# Patient Record
Sex: Male | Born: 1940 | Race: White | Hispanic: No | Marital: Married | Smoking: Former smoker
Health system: Southern US, Community
[De-identification: ages and names within clinical notes are randomized; demographics above are authoritative.]

## PROBLEM LIST (undated history)

## (undated) DIAGNOSIS — N289 Disorder of kidney and ureter, unspecified: Secondary | ICD-10-CM

## (undated) DIAGNOSIS — E7800 Pure hypercholesterolemia, unspecified: Secondary | ICD-10-CM

## (undated) DIAGNOSIS — K219 Gastro-esophageal reflux disease without esophagitis: Secondary | ICD-10-CM

## (undated) DIAGNOSIS — N400 Enlarged prostate without lower urinary tract symptoms: Secondary | ICD-10-CM

## (undated) DIAGNOSIS — I219 Acute myocardial infarction, unspecified: Secondary | ICD-10-CM

## (undated) DIAGNOSIS — I10 Essential (primary) hypertension: Secondary | ICD-10-CM

## (undated) HISTORY — PX: CHOLECYSTECTOMY: SHX55

## (undated) HISTORY — DX: Acute myocardial infarction, unspecified: I21.9

## (undated) HISTORY — PX: COLONOSCOPY: SHX174

## (undated) HISTORY — PX: APPENDECTOMY: SHX54

## (undated) HISTORY — PX: FACIAL COSMETIC SURGERY: SHX629

---

## 2011-04-17 ENCOUNTER — Emergency Department (HOSPITAL_COMMUNITY): Payer: Medicare Other

## 2011-04-17 ENCOUNTER — Encounter (HOSPITAL_COMMUNITY): Payer: Self-pay

## 2011-04-17 ENCOUNTER — Emergency Department (HOSPITAL_COMMUNITY)
Admission: EM | Admit: 2011-04-17 | Discharge: 2011-04-17 | Disposition: A | Discharge status: Home / Self Care | Payer: Medicare Other | Attending: Emergency Medicine | Admitting: Emergency Medicine

## 2011-04-17 ENCOUNTER — Other Ambulatory Visit: Payer: Self-pay

## 2011-04-17 DIAGNOSIS — M549 Dorsalgia, unspecified: Secondary | ICD-10-CM | POA: Insufficient documentation

## 2011-04-17 DIAGNOSIS — Z7982 Long term (current) use of aspirin: Secondary | ICD-10-CM | POA: Insufficient documentation

## 2011-04-17 DIAGNOSIS — I10 Essential (primary) hypertension: Secondary | ICD-10-CM | POA: Insufficient documentation

## 2011-04-17 DIAGNOSIS — R109 Unspecified abdominal pain: Secondary | ICD-10-CM | POA: Insufficient documentation

## 2011-04-17 DIAGNOSIS — E119 Type 2 diabetes mellitus without complications: Secondary | ICD-10-CM | POA: Insufficient documentation

## 2011-04-17 DIAGNOSIS — K219 Gastro-esophageal reflux disease without esophagitis: Secondary | ICD-10-CM | POA: Insufficient documentation

## 2011-04-17 DIAGNOSIS — H538 Other visual disturbances: Secondary | ICD-10-CM | POA: Insufficient documentation

## 2011-04-17 DIAGNOSIS — E7800 Pure hypercholesterolemia, unspecified: Secondary | ICD-10-CM | POA: Insufficient documentation

## 2011-04-17 DIAGNOSIS — M79609 Pain in unspecified limb: Secondary | ICD-10-CM | POA: Insufficient documentation

## 2011-04-17 DIAGNOSIS — Z79899 Other long term (current) drug therapy: Secondary | ICD-10-CM | POA: Insufficient documentation

## 2011-04-17 DIAGNOSIS — R079 Chest pain, unspecified: Secondary | ICD-10-CM | POA: Insufficient documentation

## 2011-04-17 HISTORY — DX: Gastro-esophageal reflux disease without esophagitis: K21.9

## 2011-04-17 HISTORY — DX: Essential (primary) hypertension: I10

## 2011-04-17 HISTORY — DX: Disorder of kidney and ureter, unspecified: N28.9

## 2011-04-17 HISTORY — DX: Pure hypercholesterolemia, unspecified: E78.00

## 2011-04-17 LAB — CBC
HCT: 43.6 % (ref 39.0–52.0)
MCHC: 34.6 g/dL (ref 30.0–36.0)
RDW: 13.3 % (ref 11.5–15.5)

## 2011-04-17 LAB — URINALYSIS, ROUTINE W REFLEX MICROSCOPIC
Bilirubin Urine: NEGATIVE
Glucose, UA: NEGATIVE mg/dL
Ketones, ur: NEGATIVE mg/dL
pH: 7 (ref 5.0–8.0)

## 2011-04-17 LAB — TROPONIN I
Troponin I: <0.3 ng/mL (ref <0.30)
Troponin I: <0.3 ng/mL (ref <0.30)

## 2011-04-17 LAB — COMPREHENSIVE METABOLIC PANEL
AST: 28 U/L (ref 0–37)
BUN: 16 mg/dL (ref 6–23)
CO2: 26 mEq/L (ref 19–32)
Chloride: 105 mEq/L (ref 96–112)
Creatinine, Ser: 1.09 mg/dL (ref 0.50–1.35)
GFR calc Af Amer: 79 mL/min — ABNORMAL LOW (ref >90)
GFR calc non Af Amer: 68 mL/min — ABNORMAL LOW (ref >90)
Glucose, Bld: 118 mg/dL — ABNORMAL HIGH (ref 70–99)
Total Bilirubin: 1.2 mg/dL (ref 0.3–1.2)

## 2011-04-17 LAB — LIPASE, BLOOD: Lipase: 34 U/L (ref 11–59)

## 2011-04-17 LAB — DIFFERENTIAL
Basophils Absolute: 0 10*3/uL (ref 0.0–0.1)
Basophils Relative: 0 % (ref 0–1)
Monocytes Absolute: 0.5 10*3/uL (ref 0.1–1.0)
Neutro Abs: 2.3 10*3/uL (ref 1.7–7.7)

## 2011-04-17 MED ORDER — CEPHALEXIN 500 MG PO CAPS: 500.0000 mg | ORAL_CAPSULE | Freq: Four times a day (QID) | ORAL | Status: AC

## 2011-04-17 MED ORDER — HYDROCODONE-ACETAMINOPHEN 5-325 MG PO TABS: 1.0000 | ORAL_TABLET | Freq: Four times a day (QID) | ORAL | Status: AC | PRN

## 2011-04-17 MED ORDER — IOHEXOL 300 MG/ML  SOLN: 40.0000 mL | Freq: Once | INTRAMUSCULAR | Status: DC | PRN

## 2011-04-17 MED ORDER — IOHEXOL 300 MG/ML  SOLN
100.0000 mL | Freq: Once | INTRAMUSCULAR | Status: AC | PRN
  Administered 2011-04-17: 100 mL via INTRAVENOUS

## 2011-04-17 MED ORDER — SODIUM CHLORIDE 0.9 % IV BOLUS (SEPSIS)
1000.0000 mL | Freq: Once | INTRAVENOUS | Status: AC
  Administered 2011-04-17: 1000 mL via INTRAVENOUS

## 2011-04-17 MED ORDER — ONDANSETRON HCL 4 MG/2ML IJ SOLN
4.0000 mg | Freq: Once | INTRAMUSCULAR | Status: AC
  Administered 2011-04-17: 4 mg via INTRAVENOUS
  Filled 2011-04-17: qty 2

## 2011-04-17 MED ORDER — SODIUM CHLORIDE 0.9 % IV SOLN: INTRAVENOUS | Status: DC

## 2011-04-17 NOTE — ED Notes (Signed)
Pt alert & oriented x4, stable gait. Pt given discharge instructions, paperwork & prescription(s). Patient instructed to stop at the registration desk to finish any additional paperwork. pt verbalized understanding. Pt left department w/ no further questions.  

## 2011-04-17 NOTE — ED Notes (Signed)
Pt reports pain in r groin over a week ago.  Says went to PCP and was told may have a kidney stone.  Reports history of stones.  Says has been taking ibuprofen and drinking cranberry juice.  C/O pain in both sides, back pain, pressure in head and pressure in chest x 3 or 4 days.  Denies N/V.

## 2011-04-17 NOTE — Discharge Instructions (Signed)
Today's workup without any sniffing cardiac findings we did fine gallstones not sure for rated the symptoms are not. Urinalysis with questionable urinary tract infection or maybe prostate infection will treat with antibiotic. [facility] [place] [facility] in [facility] next few days. Return for new or worse symptoms. Take antibiotic as directed. Hydrocodone provided for pain as needed.

## 2011-04-17 NOTE — ED Provider Notes (Addendum)
This chart was scribed for Shelda Jakes, MD by Williemae Natter. The patient was seen in room [facility] at 4:05 PM.  CSN: [id number]  Arrival date & time 04/17/11  1505   First MD Initiated Contact with Patient 04/17/11 1533      Chief Complaint  Patient presents with  . Chest Pain  . Abdominal Pain    (Consider location/radiation/quality/duration/timing/severity/associated sxs/prior treatment) HPI Gabriel Werner is a 71 y.o. male who presents to the Emergency Department complaining of acute onset mild abdominal pain and chest pain that started today. Pt is concerned that he has kidney stones. Went to PCP one week ago for some groin discomfort and pain in right flank and has been drinking cranberry juice and taking ibuprofen.  Pressure in in left side of chest and beneath axilla that started today about 2 hours ago that is intermittent and worse when sitting. Associated pain in back and left arm for 4 days. Pt reported seeing spots when driving to [place] yesterday.  [facility] [place].  Past Medical History  Diagnosis Date  . Renal disorder     kidney stones  . Hypertension   . Hypercholesterolemia   . Diabetes mellitus     "borderline"  . Acid reflux     Past Surgical History  Procedure Date  . Colonoscopy   . Facial cosmetic surgery     skin tags removed    No family history on file.  History  Substance Use Topics  . Smoking status: Not on file  . Smokeless tobacco: Not on file  . Alcohol Use: No      Review of Systems  Constitutional: Negative for fever.  HENT: Negative for congestion, sore throat and neck pain.   Eyes: Positive for visual disturbance.  Respiratory: Positive for cough (productive cough). Negative for shortness of breath.   Cardiovascular: Negative for leg swelling.  Gastrointestinal: Positive for abdominal pain. Negative for nausea and vomiting.  Genitourinary: Negative for dysuria.  Musculoskeletal: Positive for back  pain.  Skin: Negative for rash.  Neurological: Negative for headaches.    Allergies  Lisinopril  Home Medications   Current Outpatient Rx  Name Route Sig Dispense Refill  . ASPIRIN EC 81 MG PO TBEC Oral Take 81 mg by mouth daily.    Marland Kitchen DOXYCYCLINE HYCLATE 50 MG PO CAPS Oral Take 50 mg by mouth 2 (two) times daily.    Marland Kitchen LOSARTAN POTASSIUM 100 MG PO TABS Oral Take 100 mg by mouth daily.    Marland Kitchen OMEPRAZOLE 20 MG PO CPDR Oral Take 20 mg by mouth 2 (two) times daily.    Marland Kitchen SIMVASTATIN 40 MG PO TABS Oral Take 20 mg by mouth every evening.    . CEPHALEXIN 500 MG PO CAPS Oral Take 1 capsule (500 mg total) by mouth 4 (four) times daily. 40 capsule 0  . HYDROCODONE-ACETAMINOPHEN 5-325 MG PO TABS Oral Take 1-2 tablets by mouth every 6 (six) hours as needed for pain. 10 tablet 0    BP 123/66  Pulse 69  Temp(Src) 98 F (36.7 C) (Oral)  Resp 22  Ht 5\' 8"  (1.727 m)  Wt 240 lb (108.863 kg)  BMI 36.49 kg/m2  SpO2 96%  Physical Exam  Nursing note and vitals reviewed. Constitutional: He is oriented to person, place, and time. He appears well-developed and well-nourished.  HENT:  Head: Normocephalic and atraumatic.  Eyes: Conjunctivae and EOM are normal. Pupils are equal, round, and reactive to light.  Neck:  Normal range of motion. Neck supple.  Cardiovascular: Normal rate, regular rhythm and normal heart sounds.   Pulmonary/Chest: Effort normal and breath sounds normal. No respiratory distress.       Lungs clear  Abdominal: Soft. Bowel sounds are normal. There is no tenderness.  Musculoskeletal: Normal range of motion. He exhibits no edema.  Neurological: He is alert and oriented to person, place, and time. No cranial nerve deficit. He exhibits normal muscle tone.  Skin: Skin is warm and dry.       Good cap refill  Psychiatric: He has a normal mood and affect. His behavior is normal. Judgment and thought content normal.    ED Course  Procedures (including critical care time) DIAGNOSTIC  STUDIES: Oxygen Saturation is 98% on room air, normal by my interpretation.    COORDINATION OF CARE:  Medications  iohexol (OMNIPAQUE) 300 MG/ML solution 40 mL (not administered)  sodium chloride 0.9 % bolus 1,000 mL (1000 mL Intravenous Given 04/17/11 1658)  ondansetron (ZOFRAN) injection 4 mg (4 mg Intravenous Given 04/17/11 1658)     Date: 04/17/2011  Rate: 77  Rhythm: normal sinus rhythm  QRS Axis: normal  Intervals: normal  ST/T Wave abnormalities: nonspecific ST changes  Conduction Disutrbances:none  Narrative Interpretation:   Old EKG Reviewed: none available    Labs Reviewed  URINALYSIS, ROUTINE W REFLEX MICROSCOPIC - Abnormal; Notable for the following:    Color, Urine STRAW (*)    Specific Gravity, Urine <1.005 (*)    Hgb urine dipstick SMALL (*)    All other components within normal limits  COMPREHENSIVE METABOLIC PANEL - Abnormal; Notable for the following:    Glucose, Bld 118 (*)    GFR calc non Af Amer 68 (*)    GFR calc Af Amer 79 (*)    All other components within normal limits  CBC - Abnormal; Notable for the following:    Platelets 98 (*)    All other components within normal limits  TROPONIN I  LIPASE, BLOOD  DIFFERENTIAL  URINE MICROSCOPIC-ADD ON   Dg Chest 2 View  04/17/2011  *RADIOLOGY REPORT*  Clinical Data: Chest pain.  Generalized weakness.  Cough.  CHEST - 2 VIEW 04/17/2011:  Comparison: Visualized lung bases on CT abdomen pelvis obtained concurrently.  Findings: Nodule in the anterior left lower lobe as noted on the CT.  Lungs otherwise clear.  Cardiac silhouette upper normal in size to slightly enlarged.  Hilar and mediastinal contours otherwise unremarkable.  Degenerative changes involving the thoracic spine.  IMPRESSION: Left lower lobe lung nodule.  No acute cardiopulmonary disease.  Original Report Authenticated By: Arnell Sieving, M.D.   Ct Abdomen Pelvis W Contrast  04/17/2011  *RADIOLOGY REPORT*  Clinical Data: Chest and abdominal  pain.  CT ABDOMEN AND PELVIS WITH CONTRAST  Technique:  Multidetector CT imaging of the abdomen and pelvis was performed following the standard protocol during bolus administration of intravenous contrast.  Contrast:  40 ml Omnipaque-300  Comparison: Abdominal pelvic CT from 01/03/2006, [facility]  Findings: 3 mm right lower lobe lung nodule on image 4, unchanged. A lingular nodule measures 1.9 cm by 1.5 cm on image 4.  1.6 x 1.3 cm on 01/03/2006. 1.7 cm cranial caudal today on coronal image 75. 1.4 cm at the same level on the prior.  Mild cardiomegaly, without pericardial or pleural effusion.  Probable tiny cyst in the left lobe of the liver on image 14. Splenule.  Normal stomach.  Somewhat prominent pancreatic tail, including on  image 31, unchanged and therefore within normal variation.  Partially fatty replaced body and head of the pancreas.  Cholelithiasis without cholecystitis or biliary ductal dilatation.  Normal adrenal glands.  Mildly scarred interpolar left kidney. Punctate collecting systems stones within the right kidney are suspected.  Most apparent on coronal reformatted images.  Mild non aneurysmal infrarenal abdominal aortic dilatation.  This is unchanged, at 2.4 cm. No retroperitoneal or retrocrural adenopathy.  Scattered colonic diverticula.  Normal terminal ileum and appendix. Small jejunal mesenteric nodes with increased density in the mesenteric fat.  Image 42 today. Similar to on the prior exam. Small bowel otherwise within normal limits, without ascites.  No pelvic adenopathy.    Normal urinary bladder and prostate.  No significant free fluid.  Degenerative disc disease at the lumbosacral junction  IMPRESSION:  1.  No definite explanation for abdominal pain. 2.  Cholelithiasis. 3.  Punctate nonobstructive renal calculi suspected on the right. 4.  Stable mesenteric findings which may represent mesenteric adenitis/panniculitis. 5.  Posterior lingular nodule.  This has mildly enlarged  since the prior exam of 01/03/2006.  This growth pattern is not typical of malignancy.  Favored to represent interval enlargement of a benign lesion such as a hamartoma.  Low grade malignancy felt less likely. This enlargement does warrant followup with chest CT at 3 months. If multidisciplinary follow up management is desired, this is available in the [facility] through the [facility] [phone].  Original Report Authenticated By: Consuello Bossier, M.D.   6:56 PM On recheck, pt is feeling fine. Pt notified of lab results and scans. Pt to continue having cardiac markers monitored.  1. Flank pain   2. Chest pain       MDM  Workup in the emergency department negative for any sniffing cardiac event chest x-ray without any new findings other than a pulmonary nodule. CT scan does find cholelithiasis not sure if related to any of the symptoms also up in the right kidney there is a punctate renal stone but no evidence of ureteral stone. Urinalysis with a few white cells and a few red blood cells we'll treat as if UTI or maybe prostatitis urine culture sent will treat patient with Keflex for the next 10 days. troponins x2 last one done at the six-hour mark both negative.  [facility] [place] [facility] for further evaluation of the gallstones and followup of the urinary tract infection.    I personally performed the services described in this documentation, which was scribed in my presence. The recorded information has been reviewed and considered.          Shelda Jakes, MD 04/17/11 2122  Shelda Jakes, MD 04/19/11 [phone]

## 2011-04-19 LAB — URINE CULTURE
Colony Count: NO GROWTH
Culture: NO GROWTH

## 2016-05-16 ENCOUNTER — Emergency Department (HOSPITAL_COMMUNITY)
Admission: EM | Admit: 2016-05-16 | Discharge: 2016-05-16 | Disposition: A | Discharge status: Home / Self Care | Payer: Medicare Other | Attending: Emergency Medicine | Admitting: Emergency Medicine

## 2016-05-16 ENCOUNTER — Encounter (HOSPITAL_COMMUNITY): Payer: Self-pay | Admitting: Emergency Medicine

## 2016-05-16 ENCOUNTER — Emergency Department (HOSPITAL_COMMUNITY): Payer: Medicare Other

## 2016-05-16 DIAGNOSIS — Z87891 Personal history of nicotine dependence: Secondary | ICD-10-CM | POA: Insufficient documentation

## 2016-05-16 DIAGNOSIS — N50811 Right testicular pain: Secondary | ICD-10-CM | POA: Diagnosis not present

## 2016-05-16 DIAGNOSIS — Z7982 Long term (current) use of aspirin: Secondary | ICD-10-CM | POA: Insufficient documentation

## 2016-05-16 DIAGNOSIS — I10 Essential (primary) hypertension: Secondary | ICD-10-CM | POA: Diagnosis not present

## 2016-05-16 DIAGNOSIS — R1031 Right lower quadrant pain: Secondary | ICD-10-CM | POA: Diagnosis present

## 2016-05-16 DIAGNOSIS — Z79899 Other long term (current) drug therapy: Secondary | ICD-10-CM | POA: Diagnosis not present

## 2016-05-16 DIAGNOSIS — N50819 Testicular pain, unspecified: Secondary | ICD-10-CM

## 2016-05-16 DIAGNOSIS — E119 Type 2 diabetes mellitus without complications: Secondary | ICD-10-CM | POA: Insufficient documentation

## 2016-05-16 HISTORY — DX: Benign prostatic hyperplasia without lower urinary tract symptoms: N40.0

## 2016-05-16 LAB — CBC WITH DIFFERENTIAL/PLATELET
Basophils Absolute: 0 10*3/uL (ref 0.0–0.1)
Basophils Relative: 1 %
EOS ABS: 0.1 10*3/uL (ref 0.0–0.7)
Eosinophils Relative: 2 %
HCT: 44 % (ref 39.0–52.0)
Hemoglobin: 15.4 g/dL (ref 13.0–17.0)
LYMPHS ABS: 1.2 10*3/uL (ref 0.7–4.0)
Lymphocytes Relative: 30 %
MCH: 32 pg (ref 26.0–34.0)
MCHC: 35 g/dL (ref 30.0–36.0)
MCV: 91.5 fL (ref 78.0–100.0)
MONO ABS: 0.5 10*3/uL (ref 0.1–1.0)
Monocytes Relative: 13 %
NEUTROS ABS: 2.3 10*3/uL (ref 1.7–7.7)
Neutrophils Relative %: 54 %
Platelets: 92 10*3/uL — ABNORMAL LOW (ref 150–400)
RBC: 4.81 MIL/uL (ref 4.22–5.81)
RDW: 12.7 % (ref 11.5–15.5)
WBC: 4.1 10*3/uL (ref 4.0–10.5)

## 2016-05-16 LAB — URINALYSIS, ROUTINE W REFLEX MICROSCOPIC
BACTERIA UA: NONE SEEN
BILIRUBIN URINE: NEGATIVE
GLUCOSE, UA: NEGATIVE mg/dL
KETONES UR: NEGATIVE mg/dL
LEUKOCYTES UA: NEGATIVE
NITRITE: NEGATIVE
PROTEIN: NEGATIVE mg/dL
Specific Gravity, Urine: 1.01 (ref 1.005–1.030)
Squamous Epithelial / LPF: NONE SEEN
pH: 5 (ref 5.0–8.0)

## 2016-05-16 LAB — COMPREHENSIVE METABOLIC PANEL
ALT: 30 U/L (ref 17–63)
ANION GAP: 7 (ref 5–15)
AST: 26 U/L (ref 15–41)
Albumin: 4.1 g/dL (ref 3.5–5.0)
Alkaline Phosphatase: 44 U/L (ref 38–126)
BUN: 18 mg/dL (ref 6–20)
CO2: 23 mmol/L (ref 22–32)
Calcium: 9.4 mg/dL (ref 8.9–10.3)
Chloride: 106 mmol/L (ref 101–111)
Creatinine, Ser: 1.11 mg/dL (ref 0.61–1.24)
GFR calc Af Amer: >60 mL/min (ref >60)
GFR calc non Af Amer: >60 mL/min (ref >60)
Glucose, Bld: 123 mg/dL — ABNORMAL HIGH (ref 65–99)
POTASSIUM: 3.9 mmol/L (ref 3.5–5.1)
SODIUM: 136 mmol/L (ref 135–145)
TOTAL PROTEIN: 7 g/dL (ref 6.5–8.1)
Total Bilirubin: 1.7 mg/dL — ABNORMAL HIGH (ref 0.3–1.2)

## 2016-05-16 MED ORDER — TRAMADOL HCL 50 MG PO TABS: 50.0000 mg | ORAL_TABLET | Freq: Four times a day (QID) | ORAL | 0 refills | Status: DC | PRN

## 2016-05-16 NOTE — ED Triage Notes (Signed)
Patient c/o right groin pain x5 days. Per patient constant dull pain that is progressively getting worse. Unsure of any swelling. Patient does state occasional right flank pain. Hx of kidney stones in past. Per patient difficult with urinary flow due to enlarged prostate but no other urinary symptoms (pain or blood in urine).

## 2016-05-16 NOTE — ED Notes (Signed)
Dr Y in to assess 

## 2016-05-16 NOTE — ED Notes (Signed)
Patient transported to X-ray 

## 2016-05-16 NOTE — ED Notes (Signed)
Pt reports that he is unable to void at this time 

## 2016-05-16 NOTE — ED Provider Notes (Signed)
[facility] DEPT Provider Note   CSN: [id number] Arrival date & time: 05/16/16  1231     History   Chief Complaint Chief Complaint  Patient presents with  . Groin Pain    HPI Gabriel Werner is a 76 y.o. male.  HPI Patient presents with 5 days of right groin pain. States the pain is constant and dull in nature though he does have episodic sharp pain. Also has pain in the right flank occasionally. Denies any nausea or vomiting associated with this. Patient has urinary hesitancy and frequency but states this is unchanged. Due to BPH. Denies any hematuria, fever or chills. Denies any masses in the lower abdomen or scrotum. Has previous history of kidney stone. Past Medical History:  Diagnosis Date  . Acid reflux   . Diabetes mellitus    "borderline"  . Enlarged prostate   . Hypercholesterolemia   . Hypertension   . Renal disorder    kidney stones    There are no active problems to display for this patient.   Past Surgical History:  Procedure Laterality Date  . APPENDECTOMY    . CHOLECYSTECTOMY    . COLONOSCOPY    . FACIAL COSMETIC SURGERY     skin tags removed       Home Medications    Prior to Admission medications   Medication Sig Start Date End Date Taking? Authorizing Provider  aspirin EC 81 MG tablet Take 81 mg by mouth daily.   Yes Historical Provider, MD  cyclobenzaprine (FLEXERIL) 10 MG tablet Take 10 mg by mouth 3 (three) times daily as needed for muscle spasms.   Yes Historical Provider, MD  losartan (COZAAR) 100 MG tablet Take 100 mg by mouth daily.   Yes Historical Provider, MD  ranitidine (ZANTAC) 150 MG tablet Take 150 mg by mouth 2 (two) times daily.   Yes Historical Provider, MD  simvastatin (ZOCOR) 40 MG tablet Take 20 mg by mouth every evening.   Yes Historical Provider, MD  traMADol (ULTRAM) 50 MG tablet Take 1 tablet (50 mg total) by mouth every 6 (six) hours as needed. 05/16/16   Loren Racer, MD    Family History History reviewed. No  pertinent family history.  Social History Social History  Substance Use Topics  . Smoking status: Former Smoker    Packs/day: 0.50    Years: 40.00    Types: Cigarettes    Quit date: 01/25/2002  . Smokeless tobacco: Never Used  . Alcohol use No     Allergies   Lisinopril   Review of Systems Review of Systems  Constitutional: Negative for chills and fever.  Respiratory: Negative for cough and shortness of breath.   Cardiovascular: Negative for chest pain.  Gastrointestinal: Negative for abdominal pain, diarrhea, nausea and vomiting.  Genitourinary: Positive for difficulty urinating and flank pain. Negative for decreased urine volume, discharge, dysuria, hematuria, penile pain, scrotal swelling, testicular pain and urgency.  Musculoskeletal: Positive for back pain. Negative for myalgias, neck pain and neck stiffness.  Skin: Negative for rash and wound.  Neurological: Negative for dizziness, weakness, light-headedness and headaches.  All other systems reviewed and are negative.    Physical Exam Updated Vital Signs BP (!) 110/92 (BP Location: Left Arm)   Pulse 62   Temp 98 F (36.7 C) (Oral)   Resp 18   Ht  (1.727 m)   Wt 250 lb (113.4 kg)   SpO2 100%   BMI 38.01 kg/m   Physical Exam  Constitutional: He  is oriented to person, place, and time. He appears well-developed and well-nourished. No distress.  HENT:  Head: Normocephalic and atraumatic.  Mouth/Throat: Oropharynx is clear and moist. No oropharyngeal exudate.  Eyes: EOM are normal. Pupils are equal, round, and reactive to light.  Neck: Normal range of motion. Neck supple.  Cardiovascular: Normal rate and regular rhythm.  Exam reveals no gallop and no friction rub.   No murmur heard. Pulmonary/Chest: Effort normal and breath sounds normal. No respiratory distress. He has no wheezes. He has no rales. He exhibits no tenderness.  Abdominal: Soft. Bowel sounds are normal. There is no tenderness. There is no  rebound and no guarding. No hernia.  Genitourinary: Penis normal.  Genitourinary Comments: Atrophic testicles bilaterally. No testicular tenderness. No scrotal masses.  Musculoskeletal: Normal range of motion. He exhibits no edema or tenderness.  No CVA tenderness bilaterally. No midline thoracic or lumbar tenderness. No paraspinal muscular tenderness.  Neurological: He is alert and oriented to person, place, and time.  Results reviewed is without focal deficit. Sensation fully intact.  Skin: Skin is warm and dry. No rash noted. No erythema.  Psychiatric: He has a normal mood and affect. His behavior is normal.  Nursing note and vitals reviewed.    ED Treatments / Results  Labs (all labs ordered are listed, but only abnormal results are displayed) Labs Reviewed  CBC WITH DIFFERENTIAL/PLATELET - Abnormal; Notable for the following:       Result Value   Platelets 92 (*)    All other components within normal limits  COMPREHENSIVE METABOLIC PANEL - Abnormal; Notable for the following:    Glucose, Bld 123 (*)    Total Bilirubin 1.7 (*)    All other components within normal limits  URINALYSIS, ROUTINE W REFLEX MICROSCOPIC - Abnormal; Notable for the following:    Hgb urine dipstick SMALL (*)    All other components within normal limits    EKG  EKG Interpretation None       Radiology Ct Renal Stone Study  Result Date: 05/16/2016 CLINICAL DATA:  Right groin pain for the past 5 days, progressively worsening. Intermittent right flank pain. History of nephrolithiasis. Prostate enlargement. EXAM: CT ABDOMEN AND PELVIS WITHOUT CONTRAST TECHNIQUE: Multidetector CT imaging of the abdomen and pelvis was performed following the standard protocol without IV contrast. COMPARISON:  04/17/2011. FINDINGS: Lower chest: The previously demonstrated 1.9 x 1.5 cm lingular nodule currently measures 1.7 x 1.5 cm on image number 3 of series 4. The previously demonstrated 3 mm peripheral nodule in the right  lower lobe currently measures 6 mm in maximum diameter on image number 6 of series 4. This is unchanged in the coronal projection, compatible with differences in slice positioning in the axial projection. This was previously unchanged since 01/03/2006. No new nodules or pleural fluid. Hepatobiliary: Stable small left lobe liver cyst. Cholecystectomy clips. Pancreas: Unremarkable. No pancreatic ductal dilatation or surrounding inflammatory changes. Spleen: Normal in size without focal abnormality. Adrenals/Urinary Tract: Small bilateral lower pole renal calculi, measuring 3 mm or less in maximum diameter each. No bladder or ureteral calculi and no hydronephrosis. Stable lower pole left renal cortical scar. Normal appearing adrenal glands. Stomach/Bowel: Multiple colonic diverticula without evidence of diverticulitis. Surgically absent appendix. Unremarkable small bowel and stomach. Vascular/Lymphatic: Atheromatous arterial calcifications without aneurysm. No enlarged lymph nodes. Reproductive: Normal sized prostate gland containing coarse calcifications. Other: Stable accessory splenule. Small umbilical hernia containing fat. Musculoskeletal: Lumbar and lower thoracic spine degenerative changes. IMPRESSION: 1. No acute abnormality.  2. Stable benign nodules of both lung bases. 3. Small, bilateral lower pole nonobstructing renal calculi. 4. Colonic diverticulosis. Electronically Signed   By: Beckie Salts M.D.   On: 05/16/2016 14:48    Procedures Procedures (including critical care time)  Medications Ordered in ED Medications - No data to display   Initial Impression / Assessment and Plan / ED Course  I have reviewed the triage vital signs and the nursing notes.  Pertinent labs & imaging results that were available during my care of the patient were reviewed by me and considered in my medical decision making (see chart for details).     Patient with 5 days of right-sided groin pain. CT abdomen without  acute findings. Possibly patient has had a recently passed ureteral stone. Very low suspicion for testicular torsion. Patient does need to have ultrasound of the testicle other causes for his pain. We'll arrange to have testicular ultrasound tomorrow. Patient understands the need to return immediately for worsening pain, vomiting, fever or for any concerns. He also understands need to follow-up with urology.   Final Clinical Impressions(s) / ED Diagnoses   Final diagnoses:  Testicular pain, right    New Prescriptions New Prescriptions   TRAMADOL (ULTRAM) 50 MG TABLET    Take 1 tablet (50 mg total) by mouth every 6 (six) hours as needed.     Loren Racer, MD 05/16/16 [phone]

## 2016-05-16 NOTE — ED Notes (Signed)
Dr Jeannie Fend in to reassess and speak with pt regarding test results

## 2016-05-16 NOTE — ED Notes (Signed)
Pt reports R groin pain for 5 days Followed at the [facility]  Has not seen  "I got out of church and thought I would get it checked out". Per pt

## 2016-05-17 ENCOUNTER — Other Ambulatory Visit (HOSPITAL_COMMUNITY): Payer: Self-pay | Admitting: Emergency Medicine

## 2016-05-17 DIAGNOSIS — N5082 Scrotal pain: Secondary | ICD-10-CM

## 2016-05-19 ENCOUNTER — Ambulatory Visit (HOSPITAL_COMMUNITY)
Admission: RE | Admit: 2016-05-19 | Discharge: 2016-05-19 | Disposition: A | Discharge status: Home / Self Care | Payer: Medicare Other | Source: Ambulatory Visit | Attending: Emergency Medicine | Admitting: Emergency Medicine

## 2016-05-19 DIAGNOSIS — N50819 Testicular pain, unspecified: Secondary | ICD-10-CM | POA: Diagnosis present

## 2016-05-19 DIAGNOSIS — N503 Cyst of epididymis: Secondary | ICD-10-CM | POA: Diagnosis not present

## 2016-05-19 DIAGNOSIS — N5082 Scrotal pain: Secondary | ICD-10-CM | POA: Diagnosis present

## 2017-04-25 IMAGING — US US SCROTUM
1 series · 13 of 25 positions shown · non-contrast
Comparison: None.

CLINICAL DATA: Chronic pain and tingling sensation. Testicular
atrophy.

EXAM:
SCROTAL ULTRASOUND
DOPPLER ULTRASOUND OF THE TESTICLES
TECHNIQUE: Complete ultrasound examination of the testicles, epididymis, and
other scrotal structures was performed. Color and spectral Doppler
ultrasound were also utilized to evaluate blood flow to the
testicles.

[Series 1: us scrotum · 0.05mm/px · 13 of 60 slices shown]
[im 1/60]
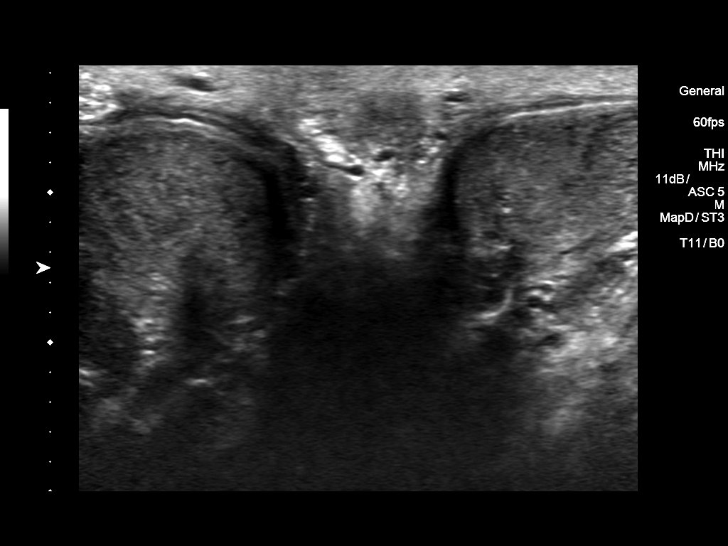
[im 5/60]
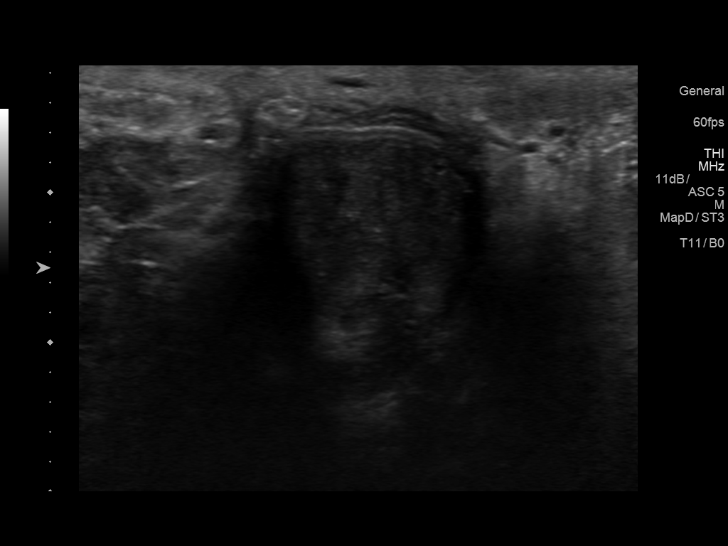
[im 10/60]
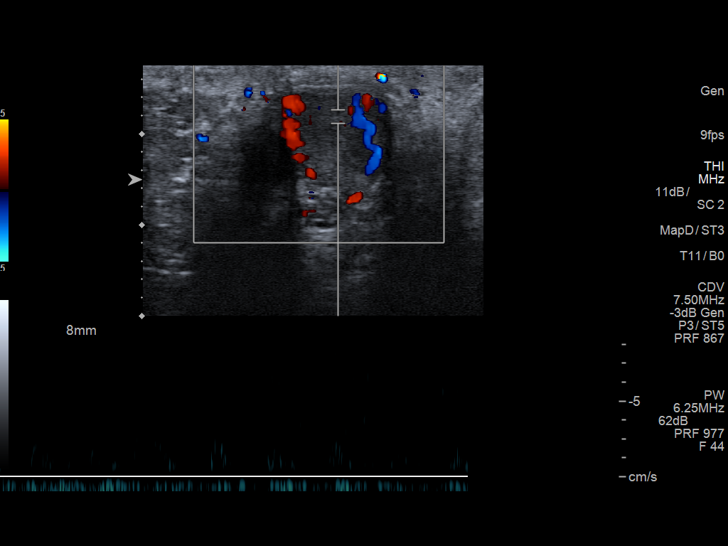
[im 15/60]
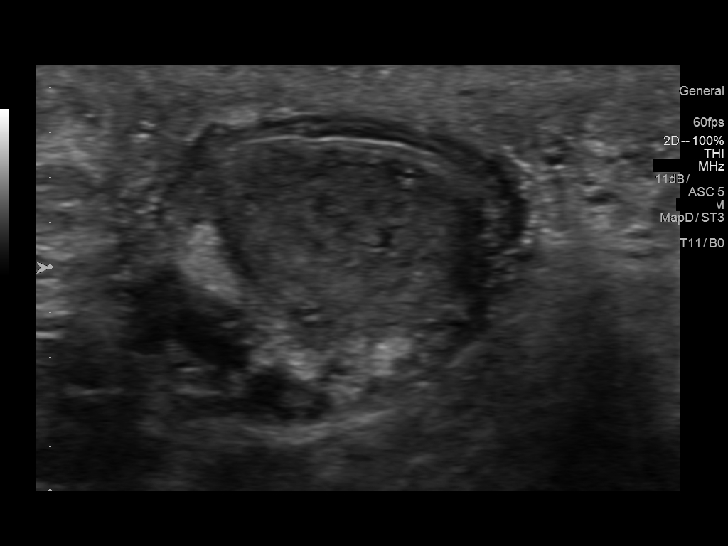
[im 20/60]
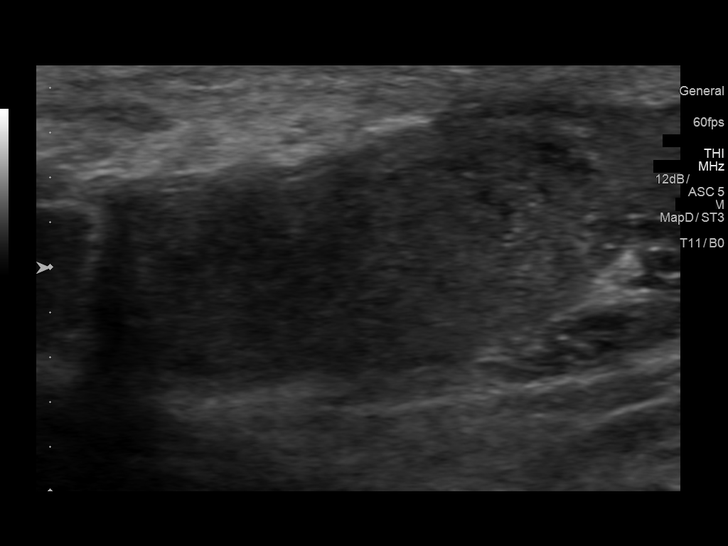
[im 25/60]
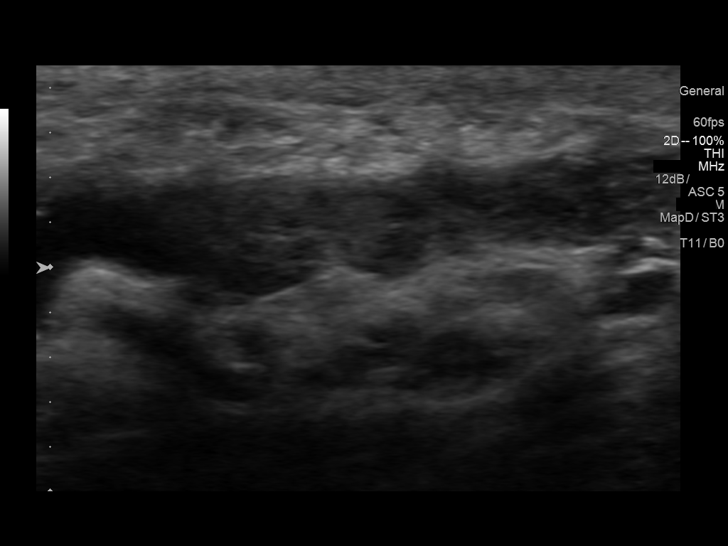
[im 30/60]
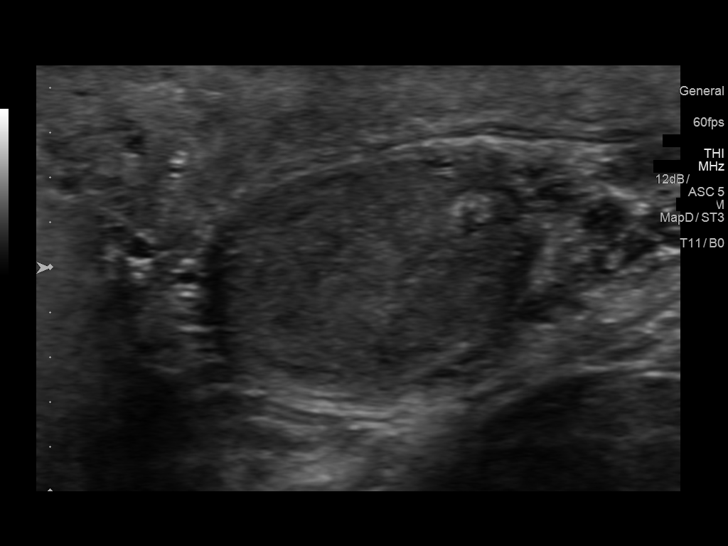
[im 35/60]
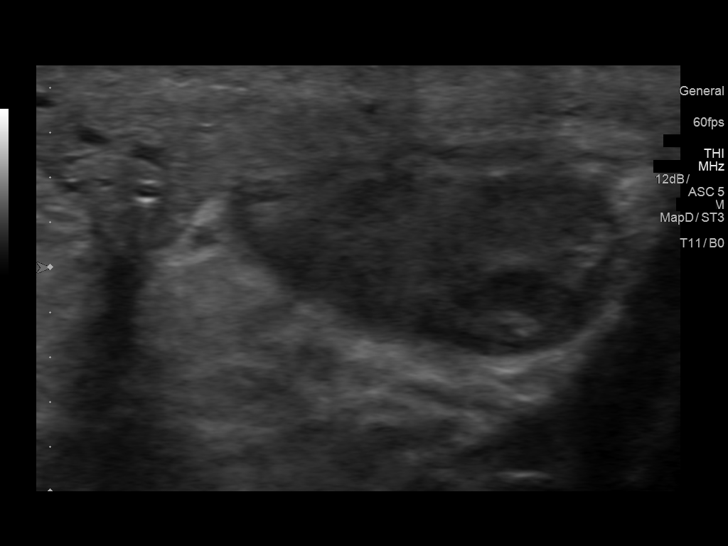
[im 40/60]
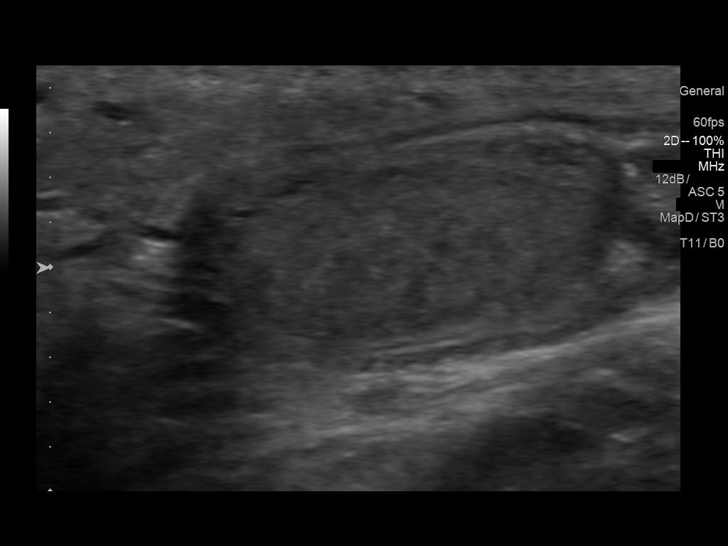
[im 45/60]
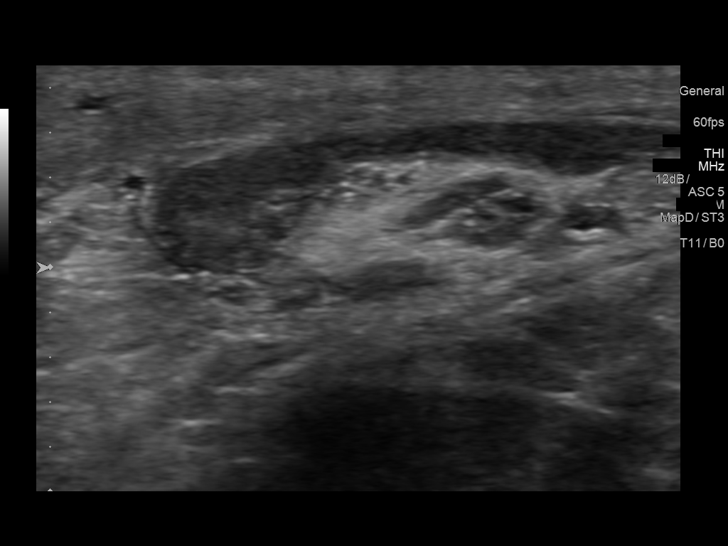
[im 50/60]
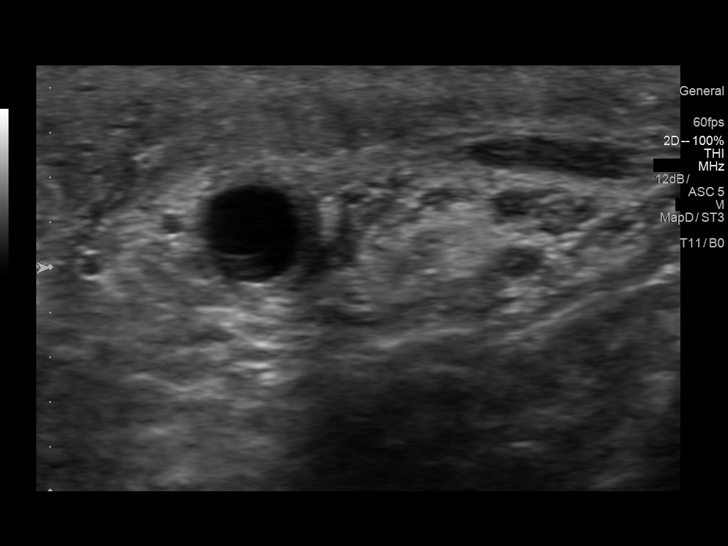
[im 55/60]
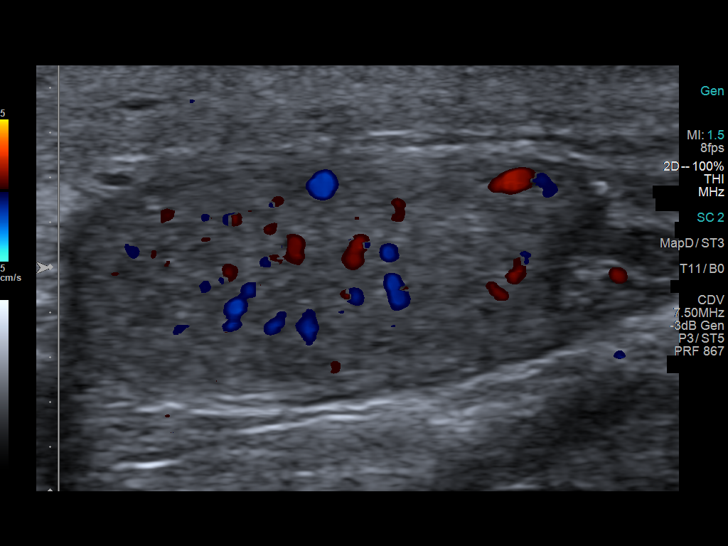
[im 60/60]
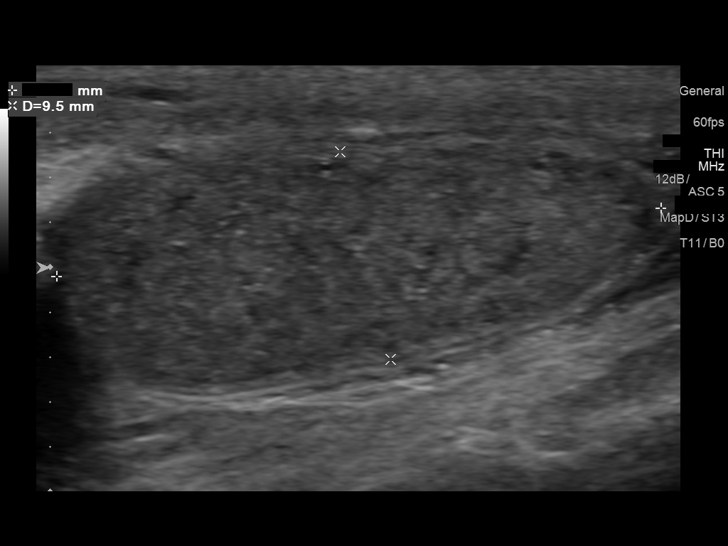

[13 of 25 positions shown; findings below may reference images not displayed]

FINDINGS: Right testicle

Measurements: 2.5 x 1.2 x 1.4 cm. No mass or microlithiasis
visualized. Echotexture of the testis is mildly inhomogeneous in a
generalized manner.

Left testicle

Measurements: 2.0 x 0.9 x 1.6 cm. No mass or microlithiasis
visualized. Echotexture of the testis is mildly inhomogeneous in a
generalized manner.

Right epididymis: Normal in size and appearance. No inflammatory
focus.

Left epididymis: There is a mildly complex cyst in the head of the
epididymis on the left measuring 5 x 5 x 4 mm. No other
extratesticular mass. No inflammatory focus.

Hydrocele:  None visualized.

Varicocele:  None visualized.

Pulsed Doppler interrogation of both testes demonstrates normal low
resistance arterial and venous waveforms bilaterally.

No scrotal abscess or scrotal wall thickening.
IMPRESSION: No testicular masses are identified. Each testis is rather small
with somewhat inhomogeneous echotexture, likely due to a degree of
underlying atrophy. No evidence of testicular torsion.

There is a small epididymal head cyst on the left with mild
septations, likely due to previous infection or hemorrhage. No other
extratesticular mass. No hyperemia or inflammation in either
extratesticular region. No appreciable hydroceles. Study otherwise
unremarkable.

## 2018-04-07 IMAGING — CT CT RENAL STONE PROTOCOL
2 of 4 series · 16 of 46 positions shown, 18 images · non-contrast
Comparison: 04/17/2011.

CLINICAL DATA: Right groin pain for the past 5 days, progressively
worsening. Intermittent right flank pain. History of
nephrolithiasis. Prostate enlargement.

EXAM:
CT ABDOMEN AND PELVIS WITHOUT CONTRAST
TECHNIQUE: Multidetector CT imaging of the abdomen and pelvis was performed
following the standard protocol without IV contrast.

[Series 2: axial st · axial · 0.95mm/px · z∈[-855,-440]mm · 13 of 91 slices shown, 15 images]
[im 4/91  soft-tissue]
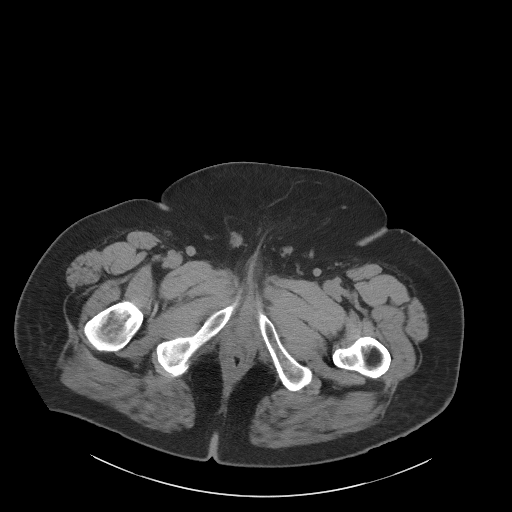
[im 4/91  bone]
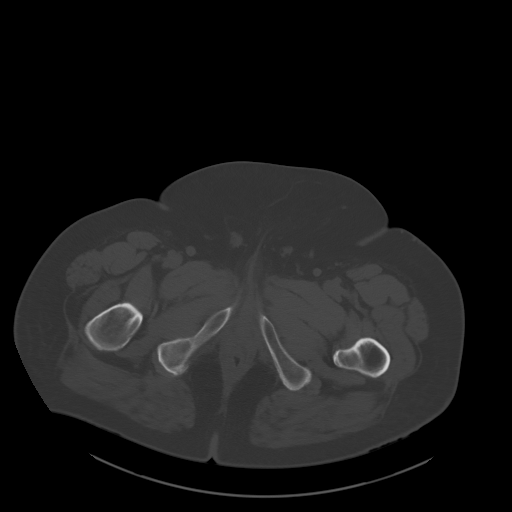
[im 11/91  soft-tissue]
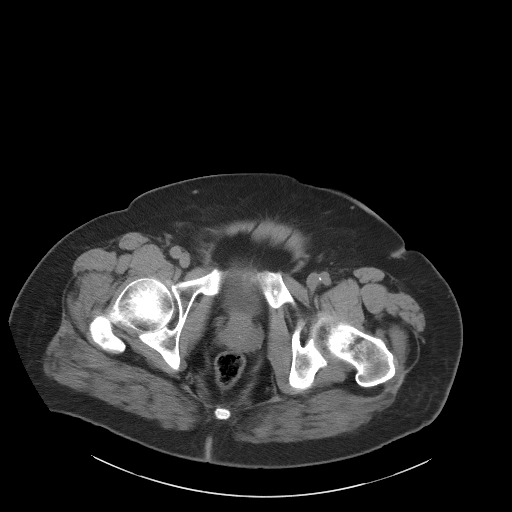
[im 19/91  soft-tissue]
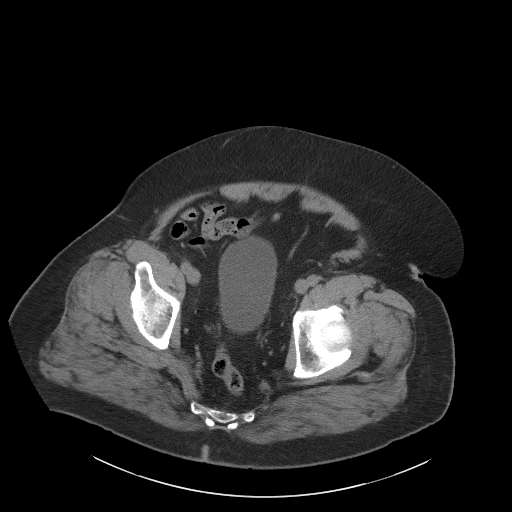
[im 26/91  soft-tissue]
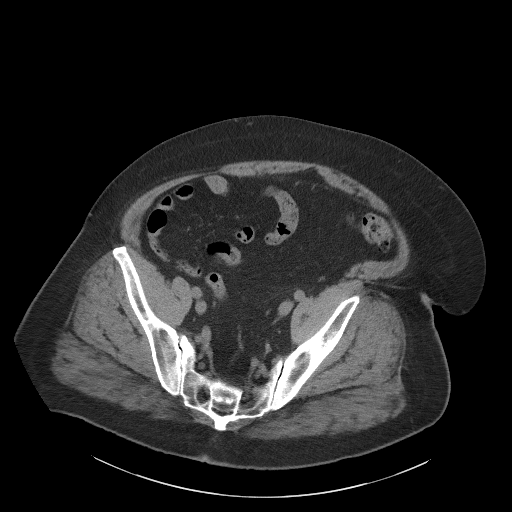
[im 33/91  soft-tissue]
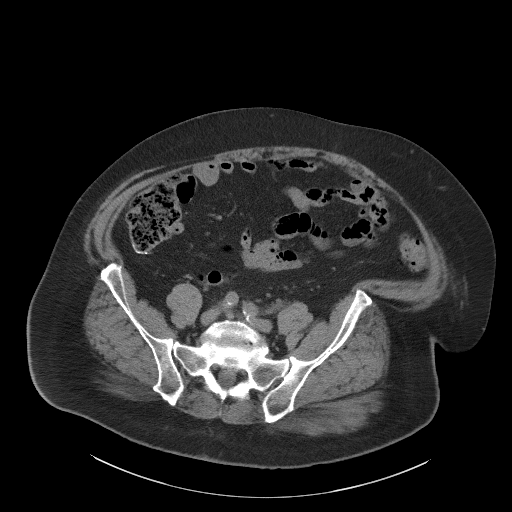
[im 40/91  soft-tissue]
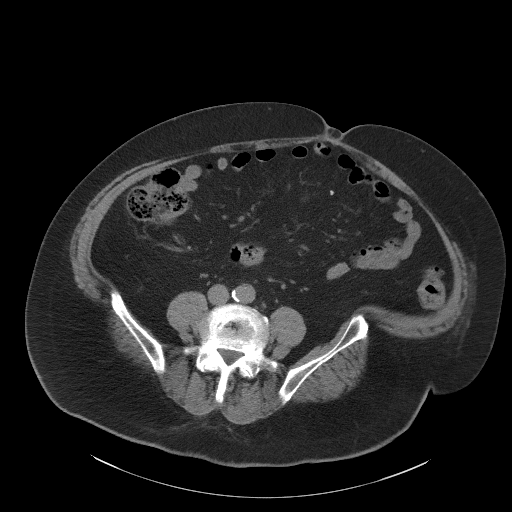
[im 47/91  soft-tissue]
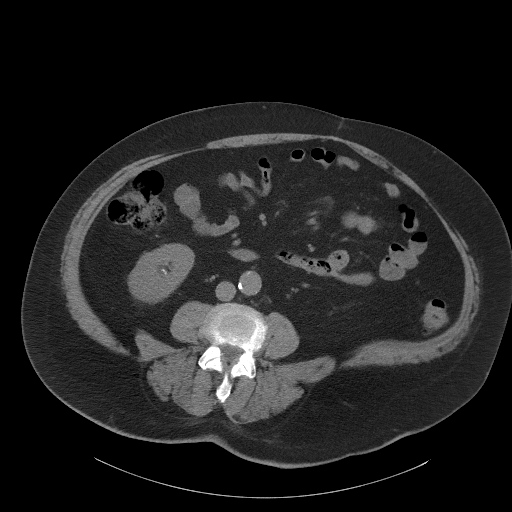
[im 51/91  soft-tissue]
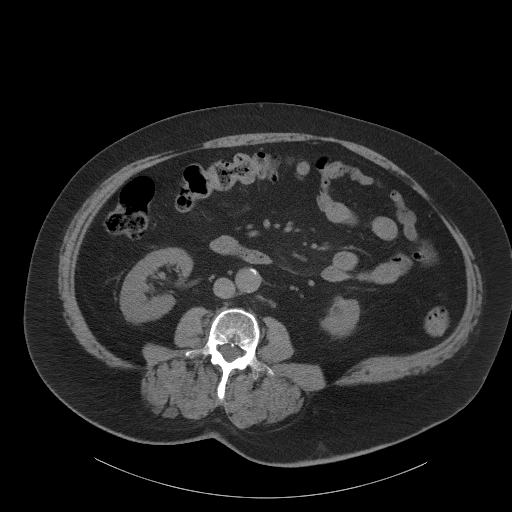
[im 58/91  soft-tissue]
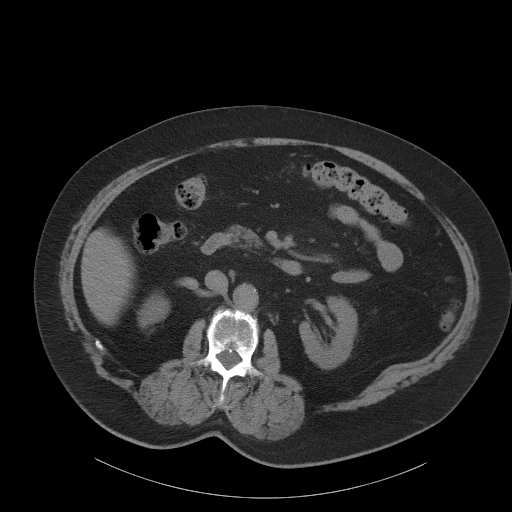
[im 58/91  bone]
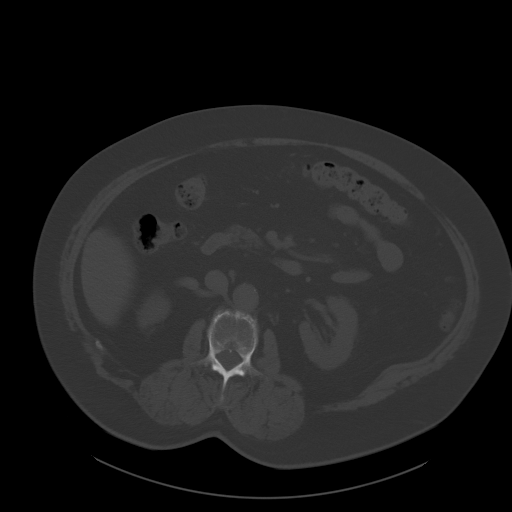
[im 65/91  soft-tissue]
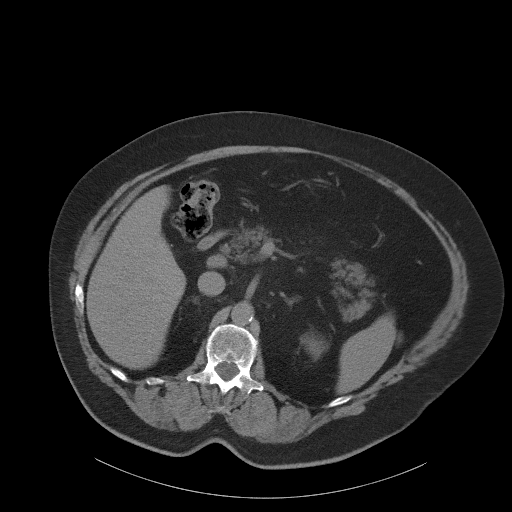
[im 73/91  soft-tissue]
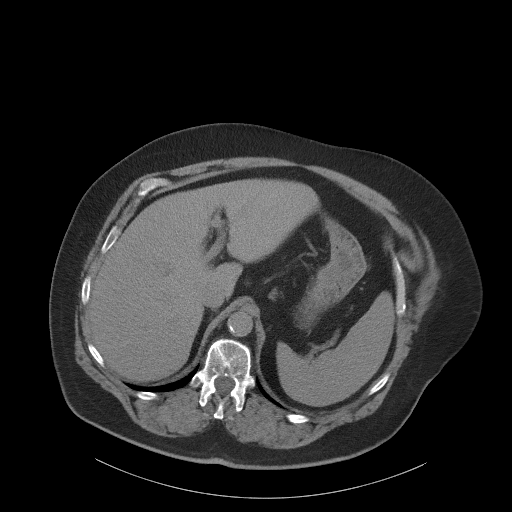
[im 80/91  soft-tissue]
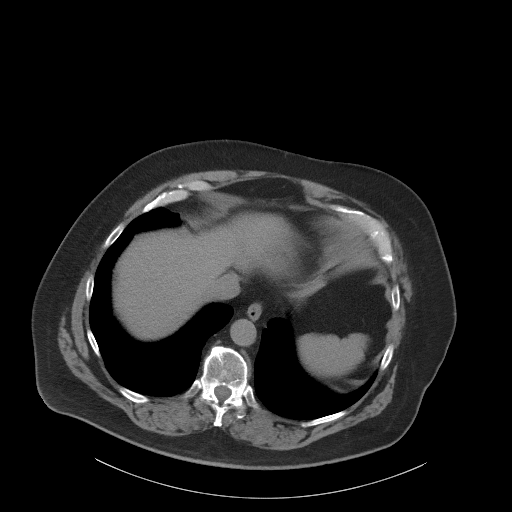
[im 87/91  soft-tissue]
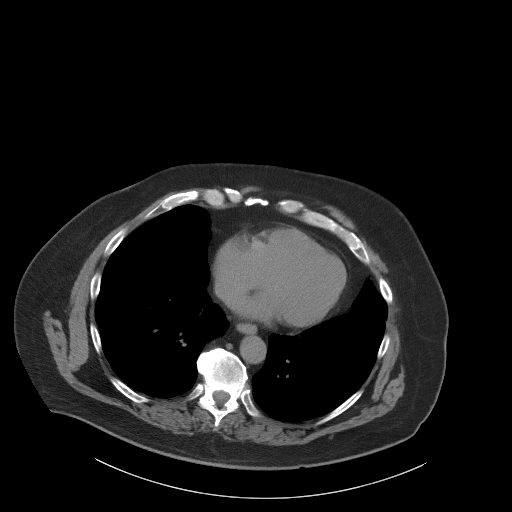

[Series 5: coronal st · coronal · 0.88mm/px · 3 of 115 slices shown]
[im 39/115  soft-tissue]
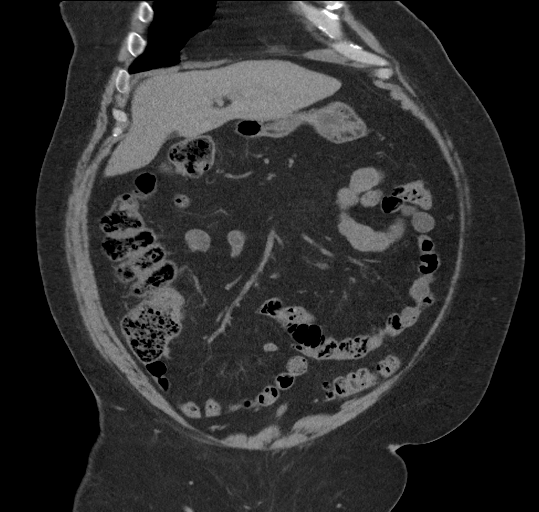
[im 51/115  soft-tissue]
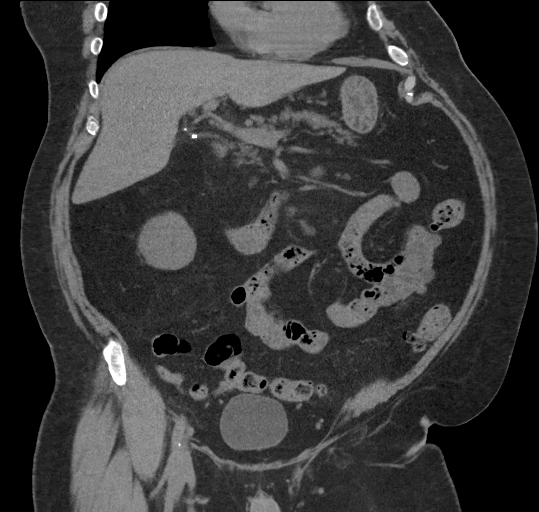
[im 64/115  soft-tissue]
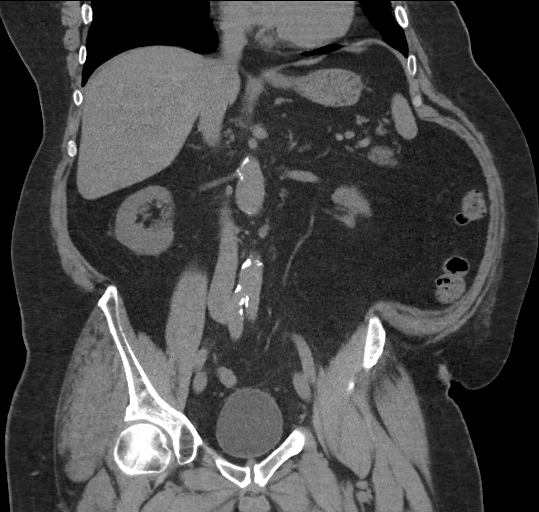

[16 of 46 positions shown; findings below may reference images not displayed]

FINDINGS: Lower chest: The previously demonstrated 1.9 x 1.5 cm lingular
nodule currently measures 1.7 x 1.5 cm on image number 3 of series
4. The previously demonstrated 3 mm peripheral nodule in the right
lower lobe currently measures 6 mm in maximum diameter on image
number 6 of series 4. This is unchanged in the coronal projection,
compatible with differences in slice positioning in the axial
projection. This was previously unchanged since 01/03/2006. No new
nodules or pleural fluid.

Hepatobiliary: Stable small left lobe liver cyst. Cholecystectomy
clips.

Pancreas: Unremarkable. No pancreatic ductal dilatation or
surrounding inflammatory changes.

Spleen: Normal in size without focal abnormality.

Adrenals/Urinary Tract: Small bilateral lower pole renal calculi,
measuring 3 mm or less in maximum diameter each. No bladder or
ureteral calculi and no hydronephrosis. Stable lower pole left renal
cortical scar. Normal appearing adrenal glands.

Stomach/Bowel: Multiple colonic diverticula without evidence of
diverticulitis. Surgically absent appendix. Unremarkable small bowel
and stomach.

Vascular/Lymphatic: Atheromatous arterial calcifications without
aneurysm. No enlarged lymph nodes.

Reproductive: Normal sized prostate gland containing coarse
calcifications.

Other: Stable accessory splenule. Small umbilical hernia containing
fat.

Musculoskeletal: Lumbar and lower thoracic spine degenerative
changes.
IMPRESSION: 1. No acute abnormality.
2. Stable benign nodules of both lung bases.
3. Small, bilateral lower pole nonobstructing renal calculi.
4. Colonic diverticulosis.

## 2019-08-03 ENCOUNTER — Ambulatory Visit
Admission: EM | Admit: 2019-08-03 | Discharge: 2019-08-03 | Disposition: A | Discharge status: Home / Self Care | Payer: No Typology Code available for payment source

## 2019-08-03 ENCOUNTER — Encounter: Payer: Self-pay | Admitting: Emergency Medicine

## 2019-08-03 ENCOUNTER — Ambulatory Visit (INDEPENDENT_AMBULATORY_CARE_PROVIDER_SITE_OTHER): Payer: No Typology Code available for payment source

## 2019-08-03 DIAGNOSIS — R0781 Pleurodynia: Secondary | ICD-10-CM | POA: Diagnosis not present

## 2019-08-03 DIAGNOSIS — G8929 Other chronic pain: Secondary | ICD-10-CM

## 2019-08-03 DIAGNOSIS — W19XXXA Unspecified fall, initial encounter: Secondary | ICD-10-CM

## 2019-08-03 DIAGNOSIS — M25511 Pain in right shoulder: Secondary | ICD-10-CM

## 2019-08-03 MED ORDER — PREDNISONE 10 MG PO TABS: 10.0000 mg | ORAL_TABLET | Freq: Two times a day (BID) | ORAL | 0 refills | Status: AC

## 2019-08-03 NOTE — Discharge Instructions (Signed)
X-rays without fractures or dislocations Continue conservative management of rest, ice, and gentle stretches Prednisone prescribed.  Take as directed and to completion Follow up with PCP if symptoms persist Return or go to the ER if you have any new or worsening symptoms (fever, chills, chest pain, numbness, tingling, shortness of breath, etc...)

## 2019-08-03 NOTE — ED Triage Notes (Signed)
Pain to RT shoulder and RT rib area since a fall in May.  Pt states he went to a rehab facility after the fall and had physical therapy, but never had a xray.

## 2019-08-03 NOTE — ED Provider Notes (Signed)
[facility]   [id number] 08/03/19 Arrival Time: 7782  CC: RT shoulder and rib PAIN  SUBJECTIVE: History from: patient. Gabriel Werner is a 79 y.o. male complains of RT shoulder and rib pain > 1 month.  Fall in May while suffering from MI.  Unsure of MOI.  Localizes the pain to the RT shoulder and RT ribcage.  Describes the pain as intermittent and dull in character.  Pain is 6/10.  Has tried OTC medications and physical therapy without relief.  Symptoms are made worse with movement.  Denies similar symptoms in the past.  Denies fever, chills, CP, SOB, dizziness, weakness, nausea, erythema, ecchymosis, effusion, weakness, numbness and tingling.  ROS: As per HPI.  All other pertinent ROS negative.     Past Medical History:  Diagnosis Date  . Acid reflux   . Diabetes mellitus    "borderline"  . Enlarged prostate   . Hypercholesterolemia   . Hypertension   . MI (myocardial infarction) (HCC)   . Renal disorder    kidney stones   Past Surgical History:  Procedure Laterality Date  . APPENDECTOMY    . CHOLECYSTECTOMY    . COLONOSCOPY    . FACIAL COSMETIC SURGERY     skin tags removed   Allergies  Allergen Reactions  . Lisinopril    No current facility-administered medications on file prior to encounter.   Current Outpatient Medications on File Prior to Encounter  Medication Sig Dispense Refill  . carvedilol (COREG) 12.5 MG tablet Take 12.5 mg by mouth 2 (two) times daily with a meal.    . gabapentin (NEURONTIN) 400 MG capsule Take 400 mg by mouth 3 (three) times daily.    Marland Kitchen losartan (COZAAR) 25 MG tablet Take 25 mg by mouth daily.    . pantoprazole (PROTONIX) 40 MG tablet Take 40 mg by mouth daily.    . pravastatin (PRAVACHOL) 40 MG tablet Take 40 mg by mouth daily.    . rivaroxaban (XARELTO) 20 MG TABS tablet Take 20 mg by mouth daily with supper.    . [DISCONTINUED] ranitidine (ZANTAC) 150 MG tablet Take 150 mg by mouth 2 (two) times daily.     Social History    Socioeconomic History  . Marital status: Married    Spouse name: Not on file  . Number of children: Not on file  . Years of education: Not on file  . Highest education level: Not on file  Occupational History  . Not on file  Tobacco Use  . Smoking status: Former Smoker    Packs/day: 0.50    Years: 40.00    Pack years: 20.00    Types: Cigarettes    Quit date: 01/25/2002    Years since quitting: 17.5  . Smokeless tobacco: Never Used  Substance and Sexual Activity  . Alcohol use: No  . Drug use: No  . Sexual activity: Not on file  Other Topics Concern  . Not on file  Social History Narrative  . Not on file   Social Determinants of Health   Financial Resource Strain:   . Difficulty of Paying Living Expenses:   Food Insecurity:   . Worried About Programme researcher, broadcasting/film/video in the Last Year:   . Barista in the Last Year:   Transportation Needs:   . Freight forwarder (Medical):   Marland Kitchen Lack of Transportation (Non-Medical):   Physical Activity:   . Days of Exercise per Week:   . Minutes of Exercise per  Session:   Stress:   . Feeling of Stress :   Social Connections:   . Frequency of Communication with Friends and Family:   . Frequency of Social Gatherings with Friends and Family:   . Attends Religious Services:   . Active Member of Clubs or Organizations:   . Attends Banker Meetings:   Marland Kitchen Marital Status:   Intimate Partner Violence:   . Fear of Current or Ex-Partner:   . Emotionally Abused:   Marland Kitchen Physically Abused:   . Sexually Abused:    No family history on file.  OBJECTIVE:  Vitals:   08/03/19 0902 08/03/19 0908  BP:  130/72  Pulse:  68  Resp:  18  Temp:  98.1 F (36.7 C)  TempSrc:  Oral  SpO2:  95%  Weight: 219 lb (99.3 kg)   Height: 5\' 8"  (1.727 m)     General appearance: ALERT; in no acute distress.  Head: NCAT Lungs: Normal respiratory effort; CTAB CV: RRR; radial pulse 2+ Musculoskeletal: rib cage; shoulder Inspection: Skin  warm, dry, clear and intact without obvious erythema, effusion, or ecchymosis.  Palpation: mildly TTP over lateral proximal ribs, just inferior to axilla; NTTP with AP and lateral compression; no point TTP about RT shoulder ROM: FROM active and passive about RT shoulder Strength: 5/5 shld abduction, 5/5 shld adduction, 5/5 elbow flexion, 5/5 elbow extension, 5/5 grip strength Skin: warm and dry Neurologic: Ambulates without difficulty; Sensation intact about the upper/ lower extremities Psychological: alert and cooperative; normal mood and affect  DIAGNOSTIC STUDIES:  DG Chest 2 View  Result Date: 08/03/2019 CLINICAL DATA:  Fall. EXAM: CHEST - 2 VIEW COMPARISON:  CT abdomen 09/17/2018, 04/03/2014. Chest x-ray 04/17/2011. FINDINGS: Mediastinum and hilar structures normal. Heart size normal. Stable nodular densities left lung base possibly a hamartoma. No acute infiltrate. No pleural effusion or pneumothorax. Heart size normal. Degenerative change thoracic spine. No acute bony abnormality identified. IMPRESSION: 1. 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 No acute cardiopulmonary disease. 2. Diffuse degenerative change thoracic spine. No acute bony abnormality. Electronically Signed   By: 04/19/2011  Register   On: 08/03/2019 09:27   DG Shoulder Right  Result Date: 08/03/2019 CLINICAL DATA:  Fall. EXAM: RIGHT SHOULDER - 2+ VIEW COMPARISON:  Chest x-ray 04/17/2011. FINDINGS: Mild acromioclavicular and glenohumeral degenerative change. No acute bony or joint abnormality. No evidence of fracture, dislocation, or separation. IMPRESSION: Mild acromioclavicular and glenohumeral degenerative change. No acute abnormality identified. Electronically Signed   By: 04/19/2011  Register   On: 08/03/2019 09:24    X-rays negative for bony abnormalities including fracture, or dislocation.    I have reviewed the x-rays myself and the radiologist interpretation. I am in agreement with the radiologist  interpretation.     ASSESSMENT & PLAN:  1. Chronic right shoulder pain   2. Rib pain on right side     Meds ordered this encounter  Medications  . predniSONE (DELTASONE) 10 MG tablet    Sig: Take 1 tablet (10 mg total) by mouth 2 (two) times daily with a meal for 5 days.    Dispense:  10 tablet    Refill:  0    Order Specific Question:   Supervising Provider    Answer:   10/04/2019 Eustace Moore   X-rays without fractures or dislocations Continue conservative management of rest, ice, and gentle stretches Prednisone prescribed.  Take as directed and to completion Follow up with PCP if symptoms persist Return or go to  the ER if you have any new or worsening symptoms (fever, chills, chest pain, numbness, tingling, shortness of breath, etc...)   [place] Controlled Substances Registry consulted for this patient. I feel the risk/benefit ratio today is favorable for proceeding with this prescription for a controlled substance. Medication sedation precautions given.  Reviewed expectations re: course of current medical issues. Questions answered. Outlined signs and symptoms indicating need for more acute intervention. Patient verbalized understanding. After Visit Summary given.    Alvino Chapel [place], PA-C 08/03/19 [phone]

## 2020-01-15 ENCOUNTER — Other Ambulatory Visit: Payer: Self-pay

## 2020-01-15 ENCOUNTER — Ambulatory Visit
Admission: EM | Admit: 2020-01-15 | Discharge: 2020-01-15 | Disposition: A | Discharge status: Home / Self Care | Payer: No Typology Code available for payment source | Attending: Family Medicine | Admitting: Family Medicine

## 2020-01-15 ENCOUNTER — Encounter: Payer: Self-pay | Admitting: Emergency Medicine

## 2020-01-15 DIAGNOSIS — M79661 Pain in right lower leg: Secondary | ICD-10-CM | POA: Diagnosis not present

## 2020-01-15 DIAGNOSIS — M79662 Pain in left lower leg: Secondary | ICD-10-CM

## 2020-01-15 DIAGNOSIS — G629 Polyneuropathy, unspecified: Secondary | ICD-10-CM

## 2020-01-15 MED ORDER — PREDNISONE 20 MG PO TABS: 20.0000 mg | ORAL_TABLET | Freq: Every day | ORAL | 0 refills | Status: AC

## 2020-01-15 NOTE — Discharge Instructions (Signed)
I have sent in prednisone for you to take once daily for 5 days total  Follow up with this office or with primary care if symptoms are persisting.  Follow up in the ER for high fever, trouble swallowing, trouble breathing, other concerning symptoms.

## 2020-01-15 NOTE — ED Triage Notes (Signed)
Numbness in the bottom of his feet x 1 month. This morning pt reports feeling numbness to bilateral legs from knee down.  Pt reports he has been taking gabapentin for leg pain for a while.

## 2020-01-15 NOTE — ED Provider Notes (Signed)
[facility]   [id number] 01/15/20 Arrival Time: 1327  BW:IOMBT PAIN  SUBJECTIVE: History from: patient. Gabriel Werner is a 79 y.o. male complains of bilateral lower leg pain that began about a week ago. Denies a precipitating event or specific injury. Localizes the pain to the lower legs and bottoms of the feet. Describes the pain as constant and numb and tingling in character. Currently takes gabapentin for neuropathy. Symptoms are made worse with activity. Reports similar symptoms in the past. Denies fever, chills, erythema, ecchymosis, effusion, weakness, saddle paresthesias, loss of bowel or bladder function.      ROS: As per HPI.  All other pertinent ROS negative.     Past Medical History:  Diagnosis Date  . Acid reflux   . Diabetes mellitus    "borderline"  . Enlarged prostate   . Hypercholesterolemia   . Hypertension   . MI (myocardial infarction) (HCC)   . Renal disorder    kidney stones   Past Surgical History:  Procedure Laterality Date  . APPENDECTOMY    . CHOLECYSTECTOMY    . COLONOSCOPY    . FACIAL COSMETIC SURGERY     skin tags removed   Allergies  Allergen Reactions  . Lisinopril    No current facility-administered medications on file prior to encounter.   Current Outpatient Medications on File Prior to Encounter  Medication Sig Dispense Refill  . carvedilol (COREG) 12.5 MG tablet Take 12.5 mg by mouth 2 (two) times daily with a meal.    . gabapentin (NEURONTIN) 400 MG capsule Take 400 mg by mouth 3 (three) times daily.    Marland Kitchen losartan (COZAAR) 25 MG tablet Take 25 mg by mouth daily.    . pantoprazole (PROTONIX) 40 MG tablet Take 40 mg by mouth daily.    . pravastatin (PRAVACHOL) 40 MG tablet Take 40 mg by mouth daily.    . rivaroxaban (XARELTO) 20 MG TABS tablet Take 20 mg by mouth daily with supper.    . [DISCONTINUED] ranitidine (ZANTAC) 150 MG tablet Take 150 mg by mouth 2 (two) times daily.     Social History   Socioeconomic History  .  Marital status: Married    Spouse name: Not on file  . Number of children: Not on file  . Years of education: Not on file  . Highest education level: Not on file  Occupational History  . Not on file  Tobacco Use  . Smoking status: Former Smoker    Packs/day: 0.50    Years: 40.00    Pack years: 20.00    Types: Cigarettes    Quit date: 01/25/2002    Years since quitting: 17.9  . Smokeless tobacco: Never Used  Substance and Sexual Activity  . Alcohol use: No  . Drug use: No  . Sexual activity: Not on file  Other Topics Concern  . Not on file  Social History Narrative  . Not on file   Social Determinants of Health   Financial Resource Strain: Not on file  Food Insecurity: Not on file  Transportation Needs: Not on file  Physical Activity: Not on file  Stress: Not on file  Social Connections: Not on file  Intimate Partner Violence: Not on file   No family history on file.  OBJECTIVE:  Vitals:   01/15/20 1355 01/15/20 1357  BP:  116/72  Pulse:  66  Resp:  19  Temp:  98.3 F (36.8 C)  TempSrc:  Oral  SpO2:  97%  Weight: 214  lb (97.1 kg)   Height: 5\' 8"  (1.727 m)     General appearance: ALERT; in no acute distress.  Head: NCAT Lungs: Normal respiratory effort CV: pulses 2+ bilaterally. Cap refill < 2 seconds Musculoskeletal:  Inspection: Skin warm, dry, clear and intact without obvious erythema, effusion, or ecchymosis.  Palpation: Nontender to palpation ROM: FROM active and passive Skin: warm and dry Neurologic: Ambulates without difficulty; Sensation intact about the upper/ lower extremities Psychological: alert and cooperative; normal mood and affect  DIAGNOSTIC STUDIES:  No results found.   ASSESSMENT & PLAN:  1. Neuropathy   2. Pain in both lower legs     Meds ordered this encounter  Medications  . predniSONE (DELTASONE) 20 MG tablet    Sig: Take 1 tablet (20 mg total) by mouth daily with breakfast for 5 days.    Dispense:  5 tablet    Refill:   0    Order Specific Question:   Supervising Provider    Answer:   Merrilee Jansky   Continue Gabapentin Prescribed steroid x 5 days Continue conservative management of rest, ice, and gentle stretches Take ibuprofen as needed for pain relief (may cause abdominal discomfort, ulcers, and GI bleeds avoid taking with other NSAIDs)  Follow up with PCP if symptoms persist Return or go to the ER if you have any new or worsening symptoms (fever, chills, chest pain, abdominal pain, changes in bowel or bladder habits, pain radiating into lower legs)   Reviewed expectations re: course of current medical issues. Questions answered. Outlined signs and symptoms indicating need for more acute intervention. Patient verbalized understanding. After Visit Summary given.       [[id number]], NP 01/15/20 1418

## 2020-05-07 ENCOUNTER — Ambulatory Visit
Admission: EM | Admit: 2020-05-07 | Discharge: 2020-05-07 | Disposition: A | Discharge status: Home / Self Care | Payer: Medicare Other | Attending: Internal Medicine | Admitting: Internal Medicine

## 2020-05-07 ENCOUNTER — Other Ambulatory Visit: Payer: Self-pay

## 2020-05-07 ENCOUNTER — Encounter: Payer: Self-pay | Admitting: Emergency Medicine

## 2020-05-07 DIAGNOSIS — G5793 Unspecified mononeuropathy of bilateral lower limbs: Secondary | ICD-10-CM

## 2020-05-07 LAB — POCT FASTING CBG KUC MANUAL ENTRY: POCT Glucose (KUC): 121 mg/dL — AB (ref 70–99)

## 2020-05-07 NOTE — Discharge Instructions (Addendum)
If your B12 test is normal please follow up with a vascular surgeon, in the mean time you may increase your night time Gabapentin to 800 mg until you follow up with your family Dr.  I will call you tomorrow with your blood work results.   [facility] [address] [place], [place] [address] [phone]

## 2020-05-07 NOTE — ED Triage Notes (Signed)
States he has neuropathy in both legs, states legs are burning

## 2020-05-07 NOTE — ED Provider Notes (Signed)
[facility]    CSN: [id number] Arrival date & time: 05/07/20  1341      History   Chief Complaint No chief complaint on file.   HPI Gabriel Werner is a 80 y.o. male who presents with worse burning sensation of dorsal feet bilaterally and R shin x 3 days. Has been dealing with this for a few years and is on Neurotin for this and the last time his dose was changed was 1 y ago. Has not been able to sleep last night due to the burning pain. Does not know the origin of his Neuropathy, but was told he is not a diabetic. Used to be a smoker for 40 years in the past. Has never seen a vascular Careers adviser. Denies burning of the soles of his feel. " They just feel spongy", but denies numbness.     Past Medical History:  Diagnosis Date  . Acid reflux   . Diabetes mellitus    "borderline"  . Enlarged prostate   . Hypercholesterolemia   . Hypertension   . MI (myocardial infarction) (HCC)   . Renal disorder    kidney stones    There are no problems to display for this patient.   Past Surgical History:  Procedure Laterality Date  . APPENDECTOMY    . CHOLECYSTECTOMY    . COLONOSCOPY    . FACIAL COSMETIC SURGERY     skin tags removed     Home Medications    Prior to Admission medications   Medication Sig Start Date End Date Taking? Authorizing Provider  carvedilol (COREG) 12.5 MG tablet Take 12.5 mg by mouth 2 (two) times daily with a meal.    [provider]  gabapentin (NEURONTIN) 400 MG capsule Take 400 mg by mouth 3 (three) times daily.    [provider]  losartan (COZAAR) 25 MG tablet Take 25 mg by mouth daily.    [provider]  pantoprazole (PROTONIX) 40 MG tablet Take 40 mg by mouth daily.    [provider]  pravastatin (PRAVACHOL) 40 MG tablet Take 40 mg by mouth daily.    [provider]  rivaroxaban (XARELTO) 20 MG TABS tablet Take 20 mg by mouth daily with supper.    [provider]  ranitidine (ZANTAC)  150 MG tablet Take 150 mg by mouth 2 (two) times daily.  08/03/19  [provider]    Family History History reviewed. No pertinent family history.  Social History Social History   Tobacco Use  . Smoking status: Former Smoker    Packs/day: 0.50    Years: 40.00    Pack years: 20.00    Types: Cigarettes    Quit date: 01/25/2002    Years since quitting: 18.2  . Smokeless tobacco: Never Used  Substance Use Topics  . Alcohol use: No  . Drug use: No     Allergies   Lisinopril   Review of Systems Review of Systems  Constitutional: Negative for fever.  Respiratory: Negative for chest tightness.   Cardiovascular: Negative for chest pain and leg swelling.  Endocrine: Negative for polydipsia and polyphagia.  Musculoskeletal: Negative for arthralgias, back pain and joint swelling.  Skin: Positive for color change. Negative for rash and wound.  Neurological: Negative for numbness.     Physical Exam Triage Vital Signs ED Triage Vitals  Enc Vitals Group     BP 05/07/20 1355 (!) 149/79     Pulse Rate 05/07/20 1355 67  Resp 05/07/20 1355 18     Temp 05/07/20 1355 98.5 F (36.9 C)     Temp Source 05/07/20 1355 Oral     SpO2 05/07/20 1355 96 %     Weight --      Height --      Head Circumference --      Peak Flow --      Pain Score 05/07/20 1405 7     Pain Loc --      Pain Edu? --      Excl. in GC? --    No data found.  Updated Vital Signs BP (!) 149/79   Pulse 67   Temp 98.5 F (36.9 C) (Oral)   Resp 18   SpO2 96%   Visual Acuity Right Eye Distance:   Left Eye Distance:   Bilateral Distance:    Right Eye Near:   Left Eye Near:    Bilateral Near:     Physical Exam Constitutional:      General: He is not in acute distress.    Appearance: He is obese. He is not toxic-appearing.  HENT:     Right Ear: External ear normal.     Left Ear: External ear normal.  Eyes:     General: No scleral icterus.    Conjunctiva/sclera: Conjunctivae normal.   Cardiovascular:     Rate and Rhythm: Regular rhythm.  Pulmonary:     Effort: Pulmonary effort is normal.     Breath sounds: Normal breath sounds.  Musculoskeletal:        General: No swelling, tenderness, deformity or signs of injury. Normal range of motion.     Cervical back: Neck supple.     Right lower leg: No edema.     Left lower leg: No edema.  Skin:    Comments: Has increased pigmentation of R lower extremity anteriorly, and very mild of the L. Has good sensation of light tough on lower legs and feet. Pedal pulses are +2/5. Capillary refill is at 3 seconds. There is no asymmetry of his calf. I was not able to reproduce his pain with palpation of areas of pain.   Neurological:     Mental Status: He is alert and oriented to person, place, and time.     Gait: Gait normal.  Psychiatric:        Mood and Affect: Mood normal.        Behavior: Behavior normal.        Thought Content: Thought content normal.        Judgment: Judgment normal.      UC Treatments / Results  Labs (all labs ordered are listed, but only abnormal results are displayed) Labs Reviewed  POCT FASTING CBG [facility] MANUAL ENTRY - Abnormal; Notable for the following components:      Result Value   POCT Glucose ([facility]) 121 (*)    All other components within normal limits  VITAMIN B12    EKG   Radiology No results found.  Procedures Procedures (including critical care time)  Medications Ordered in UC Medications - No data to display  Initial Impression / Assessment and Plan / UC Course  I have reviewed the triage vital signs and the nursing notes. Worsening neuropathy. Venostasis changes on R lower leg. I ordered CMP and B12. IN the mean time advised to increase his bed time Gabapentin to 2 per night. I will call him tomorrow with results.  Advised to FU with vascular specialist and he wanted  to see someone in [place], [place].  Final Clinical Impressions(s) / UC Diagnoses   Final diagnoses:  Neuropathy  involving both lower extremities     Discharge Instructions     If your B12 test is normal please follow up with a vascular surgeon, in the mean time you may increase your night time Gabapentin to 800 mg until you follow up with your family Dr.  I will call you tomorrow with your blood work results.   [facility] [address] [place], [place] [address] [phone]    ED Prescriptions    None     PDMP not reviewed this encounter.   Garey Ham, PA-C 05/07/20 1504

## 2020-05-08 LAB — VITAMIN B12: Vitamin B-12: 730 pg/mL (ref 232–1245)

## 2020-08-05 ENCOUNTER — Ambulatory Visit
Admission: EM | Admit: 2020-08-05 | Discharge: 2020-08-05 | Disposition: A | Discharge status: Home / Self Care | Payer: Medicare Other | Attending: Family Medicine | Admitting: Family Medicine

## 2020-08-05 ENCOUNTER — Other Ambulatory Visit: Payer: Self-pay

## 2020-08-05 ENCOUNTER — Ambulatory Visit (INDEPENDENT_AMBULATORY_CARE_PROVIDER_SITE_OTHER): Payer: Medicare Other

## 2020-08-05 ENCOUNTER — Encounter: Payer: Self-pay | Admitting: Emergency Medicine

## 2020-08-05 DIAGNOSIS — Z79899 Other long term (current) drug therapy: Secondary | ICD-10-CM | POA: Insufficient documentation

## 2020-08-05 DIAGNOSIS — J22 Unspecified acute lower respiratory infection: Secondary | ICD-10-CM | POA: Diagnosis not present

## 2020-08-05 DIAGNOSIS — R058 Other specified cough: Secondary | ICD-10-CM | POA: Diagnosis present

## 2020-08-05 DIAGNOSIS — R42 Dizziness and giddiness: Secondary | ICD-10-CM | POA: Diagnosis present

## 2020-08-05 DIAGNOSIS — R059 Cough, unspecified: Secondary | ICD-10-CM

## 2020-08-05 DIAGNOSIS — I959 Hypotension, unspecified: Secondary | ICD-10-CM

## 2020-08-05 DIAGNOSIS — R197 Diarrhea, unspecified: Secondary | ICD-10-CM | POA: Diagnosis not present

## 2020-08-05 LAB — POCT URINALYSIS DIP (MANUAL ENTRY)
Blood, UA: NEGATIVE
Glucose, UA: NEGATIVE mg/dL
Leukocytes, UA: NEGATIVE
Nitrite, UA: NEGATIVE
Protein Ur, POC: NEGATIVE mg/dL
Spec Grav, UA: 1.02 (ref 1.010–1.025)
Urobilinogen, UA: 4 E.U./dL — AB
pH, UA: 5.5 (ref 5.0–8.0)

## 2020-08-05 LAB — POCT FASTING CBG KUC MANUAL ENTRY: POCT Glucose (KUC): 116 mg/dL — AB (ref 70–99)

## 2020-08-05 MED ORDER — PROMETHAZINE-DM 6.25-15 MG/5ML PO SYRP: 5.0000 mL | ORAL_SOLUTION | Freq: Three times a day (TID) | ORAL | 0 refills | Status: AC | PRN

## 2020-08-05 MED ORDER — DOXYCYCLINE HYCLATE 100 MG PO CAPS: 100.0000 mg | ORAL_CAPSULE | Freq: Two times a day (BID) | ORAL | 0 refills | Status: AC

## 2020-08-05 NOTE — Discharge Instructions (Addendum)
[facility] of low blood pressure which I suspect is causing your dizziness and off balance. Your blood pressure today 109/67.  Your blood pressure medications may need to be adjusted.

## 2020-08-05 NOTE — ED Triage Notes (Addendum)
Bilateral ears feel stopped up,  coughing up phlegm x 1week.  Pt reports he feels off balance when he gets up in the morning x 3-4 days.

## 2020-08-07 LAB — URINE CULTURE: Culture: <10000 — AB

## 2020-08-08 NOTE — ED Provider Notes (Signed)
[facility]    CSN: [id number] Arrival date & time: 08/05/20  1442      History   Chief Complaint Chief Complaint  Patient presents with   Ear Fullness    HPI Gabriel Werner is a 80 y.o. male.   HPI Patient presents today with dizziness (thinks may be relate to cerumen impaction), constipation, urine retention. He also reports cough/occasionally productive. Patient blood pressure is lower than baseline. Patient doesn't check BP and his pressure is low today. He is a patient at the [facility] in [place]. He reports he has an upcoming appt within a few weeks. He is followed by a cardiologist. Patient has a known history of BPH and endorses a history of prior UTI.  He is afebrile. Denies nausea or vomiting. Past Medical History:  Diagnosis Date   Acid reflux    Diabetes mellitus    "borderline"   Enlarged prostate    Hypercholesterolemia    Hypertension    MI (myocardial infarction) (HCC)    Renal disorder    kidney stones    There are no problems to display for this patient.   Past Surgical History:  Procedure Laterality Date   APPENDECTOMY     CHOLECYSTECTOMY     COLONOSCOPY     FACIAL COSMETIC SURGERY     skin tags removed       Home Medications    Prior to Admission medications   Medication Sig Start Date End Date Taking? Authorizing Provider  doxycycline (VIBRAMYCIN) 100 MG capsule Take 1 capsule (100 mg total) by mouth 2 (two) times daily. 08/05/20  Yes Bing Neighbors, FNP  promethazine-dextromethorphan (PROMETHAZINE-DM) 6.25-15 MG/5ML syrup Take 5 mLs by mouth 3 (three) times daily as needed for cough. 08/05/20  Yes Bing Neighbors, FNP  carvedilol (COREG) 12.5 MG tablet Take 12.5 mg by mouth 2 (two) times daily with a meal.    [provider]  gabapentin (NEURONTIN) 400 MG capsule Take 400 mg by mouth 3 (three) times daily.    [provider]  losartan (COZAAR) 25 MG tablet Take 25 mg by mouth daily.    [provider]  pantoprazole (PROTONIX) 40 MG tablet Take 40 mg by mouth daily.    [provider]  pravastatin (PRAVACHOL) 40 MG tablet Take 40 mg by mouth daily.    [provider]  rivaroxaban (XARELTO) 20 MG TABS tablet Take 20 mg by mouth daily with supper.    [provider]  ranitidine (ZANTAC) 150 MG tablet Take 150 mg by mouth 2 (two) times daily.  08/03/19  [provider]    Family History History reviewed. No pertinent family history.  Social History Social History   Tobacco Use   Smoking status: Former    Packs/day: 0.50    Years: 40.00    Pack years: 20.00    Types: Cigarettes    Quit date: 01/25/2002    Years since quitting: 18.5   Smokeless tobacco: Never  Substance Use Topics   Alcohol use: No   Drug use: No     Allergies   Lisinopril   Review of Systems Review of Systems Pertinent negatives listed in HPI  Physical Exam Triage Vital Signs ED Triage Vitals  Enc Vitals Group     BP 08/05/20 1604 109/67     Pulse Rate 08/05/20 1604 67     Resp 08/05/20 1604 18     Temp 08/05/20 1604 98.7 F (37.1 C)  Temp Source 08/05/20 1604 Oral     SpO2 08/05/20 1604 94 %     Weight --      Height --      Head Circumference --      Peak Flow --      Pain Score 08/05/20 1605 0     Pain Loc --      Pain Edu? --      Excl. in GC? --    No data found.  Updated Vital Signs BP 109/67 (BP Location: Left Arm)   Pulse 67   Temp 98.7 F (37.1 C) (Oral)   Resp 18   SpO2 94%   Visual Acuity Right Eye Distance:   Left Eye Distance:   Bilateral Distance:    Right Eye Near:   Left Eye Near:    Bilateral Near:     Physical Exam Constitutional: Patient appears chronically ill appearing and obese, no distress HENT: Normocephalic, atraumatic, External right and left ear normal. Oropharynx is clear and moist.  Eyes: Conjunctivae and EOM are normal. PERRLA, no scleral icterus. Neck: Normal ROM. Neck supple. No JVD. No tracheal  deviation. No thyromegaly. CVS: RRR, S1/S2 +, no murmurs, no gallops, no carotid bruit.  Pulmonary: Effort normal, coarse lung sounds, + rhonchi -stridor, -wheezes, -rales.  Musculoskeletal: Normal range of motion. No edema and no tenderness.  Neuro: Alert. Normal reflexes, muscle tone coordination.  Skin: Skin is warm and dry. No rash noted. Not diaphoretic. No erythema. No pallor. Psychiatric: Normal mood and affect. Behavior, judgment, thought content normal.   UC Treatments / Results  Labs (all labs ordered are listed, but only abnormal results are displayed) Labs Reviewed  URINE CULTURE - Abnormal; Notable for the following components:      Result Value   Culture   (*)    Value: <10,000 COLONIES/mL INSIGNIFICANT GROWTH Performed at [facility], [address], [place], [place] [address]    All other components within normal limits  POCT URINALYSIS DIP (MANUAL ENTRY) - Abnormal; Notable for the following components:   Bilirubin, UA small (*)    Ketones, POC UA trace (5) (*)    Urobilinogen, UA 4.0 (*)    All other components within normal limits  POCT FASTING CBG [facility] MANUAL ENTRY - Abnormal; Notable for the following components:   POCT Glucose ([facility]) 116 (*)    All other components within normal limits    EKG   Radiology No results found.  Procedures Procedures (including critical care time)  Medications Ordered in UC Medications - No data to display  Initial Impression / Assessment and Plan / UC Course  I have reviewed the triage vital signs and the nursing notes.  Pertinent labs & imaging results that were available during my care of the patient were reviewed by me and considered in my medical decision making (see chart for details).    Hypotension unknown etiology, [facility] need medications adjusted. Likely source of dizziness. Productive cough secondary to lower respiratory infection treatment per discharge medication. ER  precautions. [facility]. Final Clinical Impressions(s) / UC Diagnoses   Final diagnoses:  Hypotension, unspecified hypotension type  Dizziness  Productive cough  Lower respiratory infection (e.g., bronchitis, pneumonia, pneumonitis, pulmonitis)     Discharge Instructions      [facility] to [facility] source of low blood pressure which I suspect is causing your dizziness and off balance. Your blood pressure today 109/67.  Your blood pressure medications may need to be adjusted.   ED Prescriptions     Medication Sig Dispense Auth. Provider   promethazine-dextromethorphan (PROMETHAZINE-DM) 6.25-15 MG/5ML syrup Take 5 mLs by mouth 3 (three) times daily as needed for cough. 140 mL Bing Neighbors, FNP   doxycycline (VIBRAMYCIN) 100 MG capsule Take 1 capsule (100 mg total) by mouth 2 (two) times daily. 20 capsule Bing Neighbors, FNP      PDMP not reviewed this encounter.   Bing Neighbors, FNP 08/10/20 2116

## 2021-09-02 ENCOUNTER — Other Ambulatory Visit: Payer: Self-pay | Admitting: *Deleted

## 2021-09-02 NOTE — Patient Outreach (Signed)
  Care Coordination   09/02/2021 Name: Gabriel Werner MRN: [id number] DOB: [birth year]   Care Coordination Outreach Attempts:  An unsuccessful telephone outreach was attempted today to offer the patient information about available care coordination services as a benefit of their health plan.   Follow Up Plan:  Additional outreach attempts will be made to offer the patient care coordination information and services.   Encounter Outcome:  No Answer  Care Coordination Interventions Activated:  No   Care Coordination Interventions:  No, not indicated    Annie Saephan C. Burgess Estelle, MSN, [facility] Gerontological Nurse Practitioner [facility] Care Management [phone]

## 2021-09-09 ENCOUNTER — Other Ambulatory Visit: Payer: Self-pay | Admitting: *Deleted

## 2021-09-09 NOTE — Patient Outreach (Signed)
  Care Coordination   Initial Visit Note   09/09/2021 Name: DERAY DAWES MRN: [id number] DOB: 19-Oct-1940  Thane Edu is a 81 y.o. year old male who sees [facility] for primary care. I spoke with Mr. Gwendolyn Nishi Otterson's spouse by phone today.  What matters to the patients health and wellness today?  [facility] IN NOVEMBER.   SDOH assessments and interventions completed:  No   Care Coordination Interventions Activated:  No  Care Coordination Interventions:  No, not indicated   Follow up plan: No further intervention required.   Encounter Outcome:  Pt. Refused   Semiah Konczal C. Burgess Estelle, MSN, [facility] Gerontological Nurse Practitioner [facility] Care Management [phone]
